# Patient Record
Sex: Male | Born: 1945 | Race: Black or African American | Hispanic: No | Marital: Married | Smoking: Never smoker
Health system: Southern US, Community
[De-identification: ages and names within clinical notes are randomized; demographics above are authoritative.]

## PROBLEM LIST (undated history)

## (undated) DIAGNOSIS — Z87442 Personal history of urinary calculi: Secondary | ICD-10-CM

## (undated) DIAGNOSIS — I10 Essential (primary) hypertension: Secondary | ICD-10-CM

## (undated) DIAGNOSIS — G4730 Sleep apnea, unspecified: Secondary | ICD-10-CM

## (undated) DIAGNOSIS — C801 Malignant (primary) neoplasm, unspecified: Secondary | ICD-10-CM

## (undated) DIAGNOSIS — E7800 Pure hypercholesterolemia, unspecified: Secondary | ICD-10-CM

## (undated) DIAGNOSIS — M1990 Unspecified osteoarthritis, unspecified site: Secondary | ICD-10-CM

## (undated) HISTORY — PX: HERNIA REPAIR: SHX51

## (undated) HISTORY — PX: COLON SURGERY: SHX602

---

## 2001-03-10 ENCOUNTER — Inpatient Hospital Stay (HOSPITAL_COMMUNITY): Admission: RE | Admit: 2001-03-10 | Discharge: 2001-03-14 | Payer: Self-pay | Admitting: Urology

## 2002-10-14 ENCOUNTER — Encounter: Payer: Self-pay | Admitting: Emergency Medicine

## 2002-10-14 ENCOUNTER — Inpatient Hospital Stay (HOSPITAL_COMMUNITY): Admission: EM | Admit: 2002-10-14 | Discharge: 2002-10-15 | Payer: Self-pay | Admitting: Emergency Medicine

## 2011-01-09 ENCOUNTER — Ambulatory Visit: Payer: 59 | Attending: Internal Medicine

## 2011-01-09 DIAGNOSIS — G4733 Obstructive sleep apnea (adult) (pediatric): Secondary | ICD-10-CM | POA: Insufficient documentation

## 2011-10-22 ENCOUNTER — Other Ambulatory Visit (HOSPITAL_COMMUNITY): Payer: Self-pay | Admitting: Internal Medicine

## 2011-10-22 ENCOUNTER — Ambulatory Visit (HOSPITAL_COMMUNITY)
Admission: RE | Admit: 2011-10-22 | Discharge: 2011-10-22 | Disposition: A | Discharge status: Home / Self Care | Payer: Medicare Other | Source: Ambulatory Visit | Attending: Internal Medicine | Admitting: Internal Medicine

## 2011-10-22 DIAGNOSIS — M25469 Effusion, unspecified knee: Secondary | ICD-10-CM | POA: Insufficient documentation

## 2011-10-22 DIAGNOSIS — R609 Edema, unspecified: Secondary | ICD-10-CM

## 2011-10-22 DIAGNOSIS — R937 Abnormal findings on diagnostic imaging of other parts of musculoskeletal system: Secondary | ICD-10-CM | POA: Insufficient documentation

## 2011-10-22 DIAGNOSIS — M25569 Pain in unspecified knee: Secondary | ICD-10-CM | POA: Insufficient documentation

## 2011-10-22 DIAGNOSIS — R52 Pain, unspecified: Secondary | ICD-10-CM

## 2011-11-12 ENCOUNTER — Other Ambulatory Visit (HOSPITAL_COMMUNITY): Payer: Self-pay | Admitting: Orthopaedic Surgery

## 2011-11-12 DIAGNOSIS — M25562 Pain in left knee: Secondary | ICD-10-CM

## 2011-11-14 ENCOUNTER — Ambulatory Visit (HOSPITAL_COMMUNITY)
Admission: RE | Admit: 2011-11-14 | Discharge: 2011-11-14 | Disposition: A | Discharge status: Home / Self Care | Payer: Medicare Other | Source: Ambulatory Visit | Attending: Orthopaedic Surgery | Admitting: Orthopaedic Surgery

## 2011-11-14 DIAGNOSIS — M23329 Other meniscus derangements, posterior horn of medial meniscus, unspecified knee: Secondary | ICD-10-CM | POA: Insufficient documentation

## 2011-11-14 DIAGNOSIS — M25569 Pain in unspecified knee: Secondary | ICD-10-CM | POA: Insufficient documentation

## 2011-11-14 DIAGNOSIS — M7120 Synovial cyst of popliteal space [Baker], unspecified knee: Secondary | ICD-10-CM | POA: Insufficient documentation

## 2011-11-14 DIAGNOSIS — M25562 Pain in left knee: Secondary | ICD-10-CM

## 2012-01-09 ENCOUNTER — Other Ambulatory Visit: Payer: Self-pay | Admitting: Orthopedic Surgery

## 2012-01-20 ENCOUNTER — Encounter (HOSPITAL_BASED_OUTPATIENT_CLINIC_OR_DEPARTMENT_OTHER): Payer: Self-pay | Admitting: *Deleted

## 2012-01-20 NOTE — Progress Notes (Signed)
Pt instructed npo p mn 3/7.  To [facility] 3/8 @ 1215.  Needs hgb, ekg on arrival.

## 2012-01-22 ENCOUNTER — Other Ambulatory Visit: Payer: Self-pay | Admitting: Orthopedic Surgery

## 2012-01-23 ENCOUNTER — Encounter (HOSPITAL_BASED_OUTPATIENT_CLINIC_OR_DEPARTMENT_OTHER)
Admission: RE | Disposition: A | Discharge status: Home / Self Care | Payer: Self-pay | Source: Ambulatory Visit | Attending: Orthopedic Surgery

## 2012-01-23 ENCOUNTER — Ambulatory Visit (HOSPITAL_BASED_OUTPATIENT_CLINIC_OR_DEPARTMENT_OTHER)
Admission: RE | Admit: 2012-01-23 | Discharge: 2012-01-23 | Disposition: A | Discharge status: Home / Self Care | Payer: Medicare Other | Source: Ambulatory Visit | Attending: Orthopedic Surgery | Admitting: Orthopedic Surgery

## 2012-01-23 ENCOUNTER — Ambulatory Visit (HOSPITAL_BASED_OUTPATIENT_CLINIC_OR_DEPARTMENT_OTHER): Payer: Medicare Other | Admitting: Anesthesiology

## 2012-01-23 ENCOUNTER — Other Ambulatory Visit: Payer: Self-pay

## 2012-01-23 ENCOUNTER — Encounter (HOSPITAL_BASED_OUTPATIENT_CLINIC_OR_DEPARTMENT_OTHER): Payer: Self-pay | Admitting: Anesthesiology

## 2012-01-23 ENCOUNTER — Encounter (HOSPITAL_BASED_OUTPATIENT_CLINIC_OR_DEPARTMENT_OTHER): Payer: Self-pay | Admitting: *Deleted

## 2012-01-23 DIAGNOSIS — X58XXXA Exposure to other specified factors, initial encounter: Secondary | ICD-10-CM | POA: Insufficient documentation

## 2012-01-23 DIAGNOSIS — Z9889 Other specified postprocedural states: Secondary | ICD-10-CM

## 2012-01-23 DIAGNOSIS — Z859 Personal history of malignant neoplasm, unspecified: Secondary | ICD-10-CM | POA: Insufficient documentation

## 2012-01-23 DIAGNOSIS — Z79899 Other long term (current) drug therapy: Secondary | ICD-10-CM | POA: Insufficient documentation

## 2012-01-23 DIAGNOSIS — G4730 Sleep apnea, unspecified: Secondary | ICD-10-CM | POA: Insufficient documentation

## 2012-01-23 DIAGNOSIS — IMO0002 Reserved for concepts with insufficient information to code with codable children: Secondary | ICD-10-CM | POA: Insufficient documentation

## 2012-01-23 DIAGNOSIS — M1710 Unilateral primary osteoarthritis, unspecified knee: Secondary | ICD-10-CM | POA: Insufficient documentation

## 2012-01-23 DIAGNOSIS — Z85038 Personal history of other malignant neoplasm of large intestine: Secondary | ICD-10-CM | POA: Insufficient documentation

## 2012-01-23 HISTORY — DX: Sleep apnea, unspecified: G47.30

## 2012-01-23 HISTORY — DX: Malignant (primary) neoplasm, unspecified: C80.1

## 2012-01-23 HISTORY — DX: Unspecified osteoarthritis, unspecified site: M19.90

## 2012-01-23 LAB — POCT I-STAT 4, (NA,K, GLUC, HGB,HCT)
Glucose, Bld: 101 mg/dL — ABNORMAL HIGH (ref 70–99)
HCT: 41 % (ref 39.0–52.0)
Hemoglobin: 13.9 g/dL (ref 13.0–17.0)
Potassium: 3.8 meq/L (ref 3.5–5.1)
Sodium: 145 meq/L (ref 135–145)

## 2012-01-23 SURGERY — ARTHROSCOPY, KNEE, WITH MEDIAL MENISCECTOMY
Anesthesia: General | Site: Knee | Laterality: Left | Wound class: Clean

## 2012-01-23 MED ORDER — LACTATED RINGERS IV SOLN
INTRAVENOUS | Status: DC
  Administered 2012-01-23 (×3): via INTRAVENOUS

## 2012-01-23 MED ORDER — OXYCODONE-ACETAMINOPHEN 5-325 MG PO TABS
1.0000 | ORAL_TABLET | ORAL | Status: DC | PRN
  Administered 2012-01-23: 1 via ORAL

## 2012-01-23 MED ORDER — ONDANSETRON HCL 4 MG/2ML IJ SOLN
INTRAMUSCULAR | Status: DC | PRN
  Administered 2012-01-23: 4 mg via INTRAVENOUS

## 2012-01-23 MED ORDER — OXYCODONE-ACETAMINOPHEN 7.5-325 MG PO TABS: 1.0000 | ORAL_TABLET | ORAL | Status: AC | PRN

## 2012-01-23 MED ORDER — DEXAMETHASONE SODIUM PHOSPHATE 4 MG/ML IJ SOLN
INTRAMUSCULAR | Status: DC | PRN
  Administered 2012-01-23: 8 mg via INTRAVENOUS

## 2012-01-23 MED ORDER — LACTATED RINGERS IV SOLN: INTRAVENOUS | Status: DC

## 2012-01-23 MED ORDER — FENTANYL CITRATE 0.05 MG/ML IJ SOLN
INTRAMUSCULAR | Status: DC | PRN
  Administered 2012-01-23 (×6): 25 ug via INTRAVENOUS
  Administered 2012-01-23: 50 ug via INTRAVENOUS

## 2012-01-23 MED ORDER — PROMETHAZINE HCL 25 MG/ML IJ SOLN: 6.2500 mg | INTRAMUSCULAR | Status: DC | PRN

## 2012-01-23 MED ORDER — PROPOFOL 10 MG/ML IV EMUL
INTRAVENOUS | Status: DC | PRN
  Administered 2012-01-23: 260 mg via INTRAVENOUS

## 2012-01-23 MED ORDER — SODIUM CHLORIDE 0.9 % IR SOLN
Status: DC | PRN
  Administered 2012-01-23: 15:00:00

## 2012-01-23 MED ORDER — METHOCARBAMOL 500 MG PO TABS: 500.0000 mg | ORAL_TABLET | Freq: Four times a day (QID) | ORAL | Status: AC

## 2012-01-23 MED ORDER — FENTANYL CITRATE 0.05 MG/ML IJ SOLN
25.0000 ug | INTRAMUSCULAR | Status: DC | PRN
  Administered 2012-01-23 (×2): 25 ug via INTRAVENOUS

## 2012-01-23 MED ORDER — LIDOCAINE HCL (CARDIAC) 20 MG/ML IV SOLN
INTRAVENOUS | Status: DC | PRN
  Administered 2012-01-23: 80 mg via INTRAVENOUS

## 2012-01-23 MED ORDER — MORPHINE SULFATE 4 MG/ML IJ SOLN
INTRAMUSCULAR | Status: DC | PRN
  Administered 2012-01-23: 4 mg

## 2012-01-23 MED ORDER — BUPIVACAINE-EPINEPHRINE 0.5% -1:200000 IJ SOLN
INTRAMUSCULAR | Status: DC | PRN
  Administered 2012-01-23: 20 mL

## 2012-01-23 MED ORDER — POVIDONE-IODINE 7.5 % EX SOLN: Freq: Once | CUTANEOUS | Status: DC

## 2012-01-23 SURGICAL SUPPLY — 51 items
BANDAGE ELASTIC 6 VELCRO ST LF (GAUZE/BANDAGES/DRESSINGS) ×4 IMPLANT
BANDAGE ESMARK 6X9 LF (GAUZE/BANDAGES/DRESSINGS) ×2 IMPLANT
BANDAGE GAUZE ELAST BULKY 4 IN (GAUZE/BANDAGES/DRESSINGS) ×3 IMPLANT
BLADE 4.2CUDA (BLADE) IMPLANT
BLADE CUDA 5.5 (BLADE) IMPLANT
BLADE CUDA SHAVER 3.5 (BLADE) ×3 IMPLANT
BLADE CUTTER GATOR 3.5 (BLADE) IMPLANT
BLADE GREAT WHITE 4.2 (BLADE) IMPLANT
BNDG ESMARK 6X9 LF (GAUZE/BANDAGES/DRESSINGS) ×3
CANISTER SUCT LVC 12 LTR MEDI- (MISCELLANEOUS) ×4 IMPLANT
CANISTER SUCTION 1200CC (MISCELLANEOUS) ×3 IMPLANT
CLOTH BEACON ORANGE TIMEOUT ST (SAFETY) ×3 IMPLANT
DRAPE ARTHROSCOPY W/POUCH 114 (DRAPES) ×3 IMPLANT
DRAPE LG THREE QUARTER DISP (DRAPES) ×3 IMPLANT
DRSG EMULSION OIL 3X3 NADH (GAUZE/BANDAGES/DRESSINGS) ×3 IMPLANT
DRSG PAD ABDOMINAL 8X10 ST (GAUZE/BANDAGES/DRESSINGS) ×2 IMPLANT
DURAPREP 26ML APPLICATOR (WOUND CARE) ×3 IMPLANT
ELECT MENISCUS 165MM 90D (ELECTRODE) IMPLANT
ELECT REM PT RETURN 9FT ADLT (ELECTROSURGICAL)
ELECTRODE REM PT RTRN 9FT ADLT (ELECTROSURGICAL) IMPLANT
GLOVE BIOGEL PI IND STRL 8 (GLOVE) ×2 IMPLANT
GLOVE BIOGEL PI INDICATOR 8 (GLOVE) ×1
GLOVE ECLIPSE 6.5 STRL STRAW (GLOVE) ×4 IMPLANT
GLOVE ECLIPSE 8.0 STRL XLNG CF (GLOVE) ×6 IMPLANT
GLOVE INDICATOR 6.5 STRL GRN (GLOVE) ×4 IMPLANT
GLOVE INDICATOR 8.0 STRL GRN (GLOVE) ×4 IMPLANT
GOWN PREVENTION PLUS LG XLONG (DISPOSABLE) ×5 IMPLANT
GOWN STRL REIN XL XLG (GOWN DISPOSABLE) ×3 IMPLANT
IV NS IRRIG 3000ML ARTHROMATIC (IV SOLUTION) ×6 IMPLANT
KNEE WRAP E Z 3 GEL PACK (MISCELLANEOUS) ×3 IMPLANT
NDL HYPO 18GX1.5 BLUNT FILL (NEEDLE) ×1 IMPLANT
NDL SAFETY ECLIPSE 18X1.5 (NEEDLE) ×2 IMPLANT
NEEDLE HYPO 18GX1.5 BLUNT FILL (NEEDLE) ×3 IMPLANT
NEEDLE HYPO 18GX1.5 SHARP (NEEDLE) ×1
PACK ARTHROSCOPY DSU (CUSTOM PROCEDURE TRAY) ×3 IMPLANT
PACK BASIN DAY SURGERY FS (CUSTOM PROCEDURE TRAY) ×3 IMPLANT
PADDING CAST ABS 4INX4YD NS (CAST SUPPLIES) ×1
PADDING CAST ABS COTTON 4X4 ST (CAST SUPPLIES) ×2 IMPLANT
PADDING WEBRIL 6 STERILE (GAUZE/BANDAGES/DRESSINGS) ×2 IMPLANT
PENCIL BUTTON HOLSTER BLD 10FT (ELECTRODE) IMPLANT
SET ARTHROSCOPY TUBING (MISCELLANEOUS) ×1
SET ARTHROSCOPY TUBING LN (MISCELLANEOUS) ×2 IMPLANT
SPONGE GAUZE 4X4 12PLY (GAUZE/BANDAGES/DRESSINGS) ×3 IMPLANT
SUT ETHIBOND 2 OS 4 DA (SUTURE) IMPLANT
SUT ETHILON 4 0 PS 2 18 (SUTURE) ×3 IMPLANT
SUT VIC AB 0 CT1 36 (SUTURE) IMPLANT
SUT VIC AB 2-0 PS2 27 (SUTURE) IMPLANT
SYRINGE 10CC LL (SYRINGE) ×3 IMPLANT
TOWEL OR 17X24 6PK STRL BLUE (TOWEL DISPOSABLE) ×3 IMPLANT
WAND 90 DEG TURBOVAC W/CORD (SURGICAL WAND) IMPLANT
WATER STERILE IRR 500ML POUR (IV SOLUTION) ×3 IMPLANT

## 2012-01-23 NOTE — H&P (Signed)
Frank [name] is an 66 y.o. male.   Chief Complaint: painful lt knee HPI:mri demonstrates medial meniscus tear  Past Medical History  Diagnosis Date  . Cancer     colon cancer  . Arthritis     left knee  . Sleep apnea     uses cpap    Past Surgical History  Procedure Date  . Hernia repair     bilateral  . Colon surgery     hx colon ca    History reviewed. No pertinent family history. Social History:  reports that he has never smoked. He does not have any smokeless tobacco history on file. He reports that he does not drink alcohol. His drug history not on file.  Allergies: No Known Allergies  Medications Prior to Admission  Medication Dose Route Frequency Provider Last Rate Last Dose  . 1 mL EPINEPHrine 1 mg/mL (1:1000) in 0.9% Normal Saline 3000 mL irrigation    PRN Illene Labrador Monteen Toops, MD      . 1 mL EPINEPHrine 1 mg/mL (1:1000) in 0.9% Normal Saline 3000 mL irrigation    PRN Illene Labrador Oluwatimileyin Vivier, MD      . bupivacaine-EPINEPHrine (MARCAINE W/ EPI) 0.5 % (with pres) injection    PRN Illene Labrador Marijayne Rauth, MD   20 mL at 01/23/12 1340  . lactated ringers infusion   Intravenous Continuous Gaetano Hawthorne, MD      . morphine 4 MG/ML injection    PRN Drucilla Schmidt, MD   4 mg at 01/23/12 1339  . povidone-iodine (BETADINE) 7.5 % scrub   Topical Once Drucilla Schmidt, MD       Medications Prior to Admission  Medication Sig Dispense Refill  . atorvastatin (LIPITOR) 40 MG tablet Take 40 mg by mouth at bedtime.      . cholecalciferol (VITAMIN D) 400 UNITS TABS Take 500 Units by mouth.      . ferrous sulfate 325 (65 FE) MG tablet Take 325 mg by mouth daily with breakfast.      . Garlic TABS Take by mouth.      . naproxen sodium (ANAPROX) 220 MG tablet Take 220 mg by mouth as needed.      . vitamin C (ASCORBIC ACID) 500 MG tablet Take 500 mg by mouth daily.      . vitamin E 400 UNIT capsule Take 400 Units by mouth daily.        Results for orders placed during the hospital encounter  of 01/23/12 (from the past 48 hour(s))  POCT I-STAT 4, (NA,K, GLUC, HGB,HCT)     Status: Abnormal   Collection Time   01/23/12  1:10 PM      Component Value Range Comment   Sodium 145  135 - 145 (mEq/L)    Potassium 3.8  3.5 - 5.1 (mEq/L)    Glucose, Bld 101 (*) 70 - 99 (mg/dL)    HCT 09.8  11.9 - 14.7 (%)    Hemoglobin 13.9  13.0 - 17.0 (g/dL)    No results found.  ROS  Blood pressure 158/95, pulse 65, temperature 98.7 F (37.1 C), temperature source Oral, resp. rate 18, height 6\' 2"  (1.88 m), weight 86.183 kg (190 lb), SpO2 98.00%. Physical Exam  Constitutional: He is oriented to person, place, and time. He appears well-developed and well-nourished.  HENT:  Head: Normocephalic and atraumatic.  Right Ear: External ear normal.  Left Ear: External ear normal.  Nose: Nose normal.  Mouth/Throat: Oropharynx is clear and  moist.  Cardiovascular: Normal rate, regular rhythm, normal heart sounds and intact distal pulses.   Respiratory: Effort normal and breath sounds normal.  GI: Soft. Bowel sounds are normal.  Musculoskeletal: Normal range of motion. He exhibits tenderness.       Tender medial joint line left knee  Neurological: He is alert and oriented to person, place, and time. He has normal reflexes.  Skin: Skin is warm and dry.  Psychiatric: He has a normal mood and affect. His behavior is normal. Judgment and thought content normal.     Assessment/Plan Torn medial meniscus lt knee Lt jnee arthroscopy with partial medial meniscectomy Alashia Brownfield P 01/23/2012, 2:13 PM

## 2012-01-23 NOTE — Transfer of Care (Signed)
Immediate Anesthesia Transfer of Care Note  Patient: Frank [name]  Procedure(s) Performed: Procedure(s) (LRB): KNEE ARTHROSCOPY WITH MEDIAL MENISECTOMY (Left)  Patient Location: Patient transported to PACU with oxygen via face mask at 4 Liters / Min  Anesthesia Type: General  Level of Consciousness: awake and alert   Airway & Oxygen Therapy: Patient Spontanous Breathing and Patient connected to face mask oxygen  Post-op Assessment: Report given to PACU RN and Post -op Vital signs reviewed and stable  Post vital signs: Reviewed and stable  Dentition: Teeth and oropharynx remain in pre-op condition  Complications: No apparent anesthesia complications

## 2012-01-23 NOTE — Anesthesia Preprocedure Evaluation (Signed)
Anesthesia Evaluation  Patient identified by MRN, date of birth, ID band Patient awake    Reviewed: Allergy & Precautions, H&P , NPO status , Patient's Chart, lab work & pertinent test results  Airway Mallampati: II TM Distance: >3 FB Neck ROM: Full    Dental No notable dental hx. (+) Caps, Teeth Intact and Dental Advisory Given   Pulmonary neg pulmonary ROS, sleep apnea and Continuous Positive Airway Pressure Ventilation ,  breath sounds clear to auscultation  Pulmonary exam normal       Cardiovascular Exercise Tolerance: Good negative cardio ROS  Rhythm:Regular Rate:Normal     Neuro/Psych negative neurological ROS  negative psych ROS   GI/Hepatic negative GI ROS, Neg liver ROS,   Endo/Other  negative endocrine ROS  Renal/GU negative Renal ROS  negative genitourinary   Musculoskeletal negative musculoskeletal ROS (+)   Abdominal   Peds negative pediatric ROS (+)  Hematology negative hematology ROS (+)   Anesthesia Other Findings   Reproductive/Obstetrics negative OB ROS                           Anesthesia Physical Anesthesia Plan  ASA: II  Anesthesia Plan: General   Post-op Pain Management:    Induction: Intravenous  Airway Management Planned: LMA  Additional Equipment:   Intra-op Plan:   Post-operative Plan:   Informed Consent: I have reviewed the patients History and Physical, chart, labs and discussed the procedure including the risks, benefits and alternatives for the proposed anesthesia with the patient or authorized representative who has indicated his/her understanding and acceptance.   Dental advisory given  Plan Discussed with: CRNA  Anesthesia Plan Comments:         Anesthesia Quick Evaluation

## 2012-01-23 NOTE — Anesthesia Procedure Notes (Signed)
Procedure Name: LMA Insertion Date/Time: 01/23/2012 2:30 PM Performed by: Lorrin Jackson Pre-anesthesia Checklist: Patient identified, Emergency Drugs available, Suction available and Patient being monitored Patient Re-evaluated:Patient Re-evaluated prior to inductionOxygen Delivery Method: Circle System Utilized Preoxygenation: Pre-oxygenation with 100% oxygen Intubation Type: IV induction Ventilation: Mask ventilation without difficulty LMA: LMA with gastric port inserted LMA Size: 4.0 Number of attempts: 1 Placement Confirmation: positive ETCO2 Tube secured with: Tape Dental Injury: Teeth and Oropharynx as per pre-operative assessment

## 2012-01-23 NOTE — Brief Op Note (Signed)
01/23/2012  3:47 PM  PATIENT:  Frank [name]  66 y.o. male  PRE-OPERATIVE DIAGNOSIS:  LEFT KNEE TORN MEDIAL MENISCUS and degenerative arthritis  POST-OPERATIVE DIAGNOSIS:  LEFT KNEE TORN MEDIAL MENISCUS and degenerative arthritis  PROCEDURE:  Procedure(s) (LRB): KNEE ARTHROSCOPY WITH partial medial meniscectomy and debridement medial femoral condyle  SURGEON:  Surgeon(s) and Role:    * Drucilla Schmidt, MD - Primary  PHYSICIAN ASSISTANT:   ASSISTANTS: nurse  ANESTHESIA:   general  EBL:  Total I/O In: 1300 [I.V.:1300] Out: -   BLOOD ADMINISTERED:none  DRAINS: none   LOCAL MEDICATIONS USED:  MARCAINE     SPECIMEN:  No Specimen  DISPOSITION OF SPECIMEN:  N/A  COUNTS:  YES  TOURNIQUET:   Total Tourniquet Time Documented: Thigh (Left) - 45 minutes  DICTATION: .Other Dictation: Dictation Number [id number]  PLAN OF CARE: Discharge to home after PACU  PATIENT DISPOSITION:  PACU - hemodynamically stable.   Delay start of Pharmacological VTE agent (>24hrs) due to surgical blood loss or risk of bleeding: yes

## 2012-01-24 NOTE — Op Note (Signed)
Frank [name], Frank NO.:  [id number]  MEDICAL RECORD NO.:  [id number]  LOCATION:                               FACILITY:  [facility]  PHYSICIAN:  Marlowe Kays, M.D.  DATE OF BIRTH:  1946/05/24  DATE OF PROCEDURE:  01/23/2012 DATE OF DISCHARGE:                              OPERATIVE REPORT   PREOPERATIVE DIAGNOSES: 1. Torn medial meniscus. 2. Osteoarthritis, left knee.  POSTOPERATIVE DIAGNOSES: 1. Torn medial meniscus. 2. Osteoarthritis, left knee.  OPERATION:  Left knee arthroscopy with partial medial meniscectomy and shaving of medial femoral condyle.  SURGEON:  Marlowe Kays, M.D.  ASSISTANT:  Nurse.  ANESTHESIA:  General.  PATHOLOGY AND JUSTIFICATION FOR PROCEDURE:  Because of painful knee, he had an MRI, which demonstrated the preoperative diagnoses.  Accordingly, he is here today for the above-mentioned surgery.  DESCRIPTION OF PROCEDURE:  Satisfied general anesthesia, Ace wrap, and knee support to right lower extremity, pneumatic tourniquet to left lower extremity with leg Esmarch'd out nonsterilely and tourniquet inflated to 325 mmHg.  Thigh stabilizer applied.  Left leg was then prepped with DuraPrep and stabilizer to ankle, draped in sterile field. Time-out performed.  Superior medial saline inflow.  First, an anterolateral portal, medial compartment joint was evaluated.  He had an area of full-thickness wear almost like a divot in the midportion of the medial tibial plateau adjacent to the very comminuted tear of the medial meniscus, which extended into the intercondylar area.  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  With a 3.5 shaver, I smoothed down the medial femoral condyle and also a portion of the medial meniscus tear, removing the remainder with combination of arthroscopic scissors and a combination of baskets.  The final meniscus was stable on probing.  Looking at the medial gutter  and suprapatellar area, he had some minimal wear of the patella, which did not require shaving.  I then reversed portals.  The lateral meniscus showed some mild synovitis, but nothing operable.  Knee joint was then irrigated to clear, all fluid possible removed.  I closed 2 entry portals with 4-0 nylon and then injected 20 cc of 0.5% Marcaine with adrenaline and 4 mg of morphine through the inflow apparatus, which was removed. This portal closed with 4-0 nylon.  Betadine, Adaptic, dry sterile dressing were applied.  He tolerated the procedure well.  At the time of this dictation, he was on his way to the recovery room in satisfactory condition with no complications.          ______________________________ Marlowe Kays, M.D.     JA/MEDQ  D:  01/23/2012  T:  01/24/2012  Job:  [id number]

## 2012-01-26 NOTE — Anesthesia Postprocedure Evaluation (Signed)
Anesthesia Post Note  Patient: Frank [name]  Procedure(s) Performed: Procedure(s) (LRB): KNEE ARTHROSCOPY WITH MEDIAL MENISECTOMY (Left)  Anesthesia type: General  Patient location: PACU  Post pain: Pain level controlled  Post assessment: Post-op Vital signs reviewed  Last Vitals:  Filed Vitals:   01/23/12 1730  BP: 156/89  Pulse: 67  Temp: 36.2 C  Resp: 16    Post vital signs: Reviewed  Level of consciousness: sedated  Complications: No apparent anesthesia complications

## 2012-02-17 ENCOUNTER — Ambulatory Visit (HOSPITAL_COMMUNITY)
Admission: RE | Admit: 2012-02-17 | Discharge: 2012-02-17 | Disposition: A | Discharge status: Home / Self Care | Payer: Medicare Other | Source: Ambulatory Visit | Attending: Orthopedic Surgery | Admitting: Orthopedic Surgery

## 2012-02-17 DIAGNOSIS — M6281 Muscle weakness (generalized): Secondary | ICD-10-CM | POA: Insufficient documentation

## 2012-02-17 DIAGNOSIS — M25669 Stiffness of unspecified knee, not elsewhere classified: Secondary | ICD-10-CM | POA: Insufficient documentation

## 2012-02-17 DIAGNOSIS — M25569 Pain in unspecified knee: Secondary | ICD-10-CM | POA: Insufficient documentation

## 2012-02-17 DIAGNOSIS — IMO0001 Reserved for inherently not codable concepts without codable children: Secondary | ICD-10-CM | POA: Insufficient documentation

## 2012-02-17 NOTE — Evaluation (Signed)
Physical Therapy Evaluation  Patient Details  Name: Frank [name] MRN: [id number] Date of Birth: 11-14-1946  Today's Date: 02/17/2012 Time: 0902-0930 Time Calculation (min): 28 min Charges: 1 eval Visit#: 1  of 4   Re-eval: 03/01/12 Assessment Diagnosis: L knee scope Surgical Date: 01/23/12 Next MD Visit: 03/01/12 Prior Therapy: None  Past Medical History:  Past Medical History  Diagnosis Date  . Cancer     colon cancer  . Arthritis     left knee  . Sleep apnea     uses cpap   Past Surgical History:  Past Surgical History  Procedure Date  . Hernia repair     bilateral  . Colon surgery     hx colon ca    Subjective Symptoms/Limitations Symptoms: Pt reports that he bumped his Lt knee about 6 months ago on a cube of bricks and it has been painful ever since.  He had surgery about 4 weeks out of surgery.  He reports overall he is feeling pretty good, and only has pain when moving it in an awkward direction.  His c/co is weakness to his legs.  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  He enjoys fixing things up.  He enjoys walking for exercise (3-4 miles).  Cognition/Observation Observation/Other Assessments Observations: Pleasant male who is able to easily sit in his chair.  Comes in with a neoprene brace on his knee No visible edema.  Portals are healing well  Assessment LLE AROM (degrees) LLE Overall AROM Comments: Lt knee 0-125 LLE Strength Left Hip Flexion: 4/5 Left Hip Extension: 3+/5 Left Hip ABduction: 4/5 Left Hip ADduction: 3+/5 Left Knee Flexion: 5/5 Left Knee Extension: 4/5 Palpation Palpation: Mild pain and tenderness to medial joint line with deep palpation.    Exercise/Treatments Supine Short Arc Quad Sets: 5 reps (10 sec holds, HEP) Straight Leg Raises: 15 reps (HEP) Sidelying Hip ABduction: 10 reps (HEP) Hip ADduction: 10 reps (HEP) Clams: 3 reps 10 sec holds Prone  Hip Extension: Left;10 reps (HEP) Other Prone Exercises: Heel Squezze 5x10 sec; HEP  Physical Therapy Assessment and Plan PT Assessment and Plan Clinical Impression Statement: Pt is a 66 year old male s/p L knee scope on 01/23/12.  After examination it was found that he current impairments including decreased muscle strength and endurance which are limitng his ability to fully participate in ADL's and IADL's including his exercise rountine.  Pt will benefit from skilled OP PT to address above impairments in order to maximize function and reach goals.  Rehab Potential: Good PT Frequency: Min 2X/week PT Duration:  (5 visits. ) PT Treatment/Interventions: Gait training;Stair training;Functional mobility training;Therapeutic activities;Therapeutic exercise;Neuromuscular re-education;Patient/family education (NO MODALITES contraindicated for CANCER) PT Plan: Cont with strengthening LE.  Functional squats/wall squats, heel/toe raises, stair training, clams (for endurance to gluteus medius).  Focus on HEP.    Goals Home Exercise Program Pt will Perform Home Exercise Program: Independently PT Goal: Perform Home Exercise Program - Progress: Goal set today PT Short Term Goals Time to Complete Short Term Goals: 2 weeks PT Short Term Goal 1: Pt will present with 5/5 strength throughout LLE in order to complete necessary ADL's. PT Short Term Goal 2: Pt will improve his LLE endurance and tolerate ambulating for 3 miles to  return to his exercise routine.   Problem List Patient Active Problem List  Diagnoses  . Muscle weakness (generalized)    PT Plan of Care PT Home Exercise Plan: see scanned document ( 4 way SLR, heel squeezes, saq) Consulted and Agree with Plan of Care:  Patient  Frank [name] 02/17/2012, 9:48 AM  Physician Documentation Your signature is required to indicate approval of the treatment plan as stated above.  Please sign and either send electronically or make a copy of this report for your files and return this physician signed original.   Please mark one 1.__approve of plan  2. ___approve of plan with the following conditions.   ______________________________                                                          _____________________ Physician Signature                                                                                                             Date

## 2012-02-24 ENCOUNTER — Ambulatory Visit (HOSPITAL_COMMUNITY)
Admission: RE | Admit: 2012-02-24 | Discharge: 2012-02-24 | Disposition: A | Discharge status: Home / Self Care | Payer: Medicare Other | Source: Ambulatory Visit | Attending: Internal Medicine | Admitting: Internal Medicine

## 2012-02-24 NOTE — Progress Notes (Signed)
Physical Therapy Treatment Patient Details  Name: Frank FLIPPEN MRN: [id number] Date of Birth: 12/09/1945  Today's Date: 02/24/2012 Time: 5284-1324 Time Calculation (min): 38 min Visit#: 2  of 4   Re-eval: 03/01/12 Charges:  therex 30'    Subjective: Symptoms/Limitations Symptoms: Pt. reports 5/10 pain upon entrance but only with certain movements.  Pt.reports compliance with HEP and is walking 2 miles a day.   Pain Assessment Currently in Pain?: Yes Pain Score:   5 Pain Location: Knee Pain Orientation: Left Pain Relieving Factors: Only has pain with certain movements.  Exercises Instructed by Trilby Leaver, SPTA under the direction of Makyiah Lie Bascom Levels, PTA/CI. Exercise/Treatments Aerobic Stationary Bike: 6'@ 4.5 Elliptical: add next visit Standing Heel Raises: 15 reps;Limitations Heel Raises Limitations: toeraises 15 reps Lateral Step Up: 10 reps;Step Height: 4";Hand Hold: 1 Forward Step Up: 10 reps;Step Height: 4";Hand Hold: 1 Functional Squat: 15 reps Supine Short Arc Quad Sets: 15 reps;Limitations Short Arc Quad Sets Limitations: D/C to HEP Straight Leg Raises: 15 reps;Limitations Straight Leg Raises Limitations: D/C to HEP Sidelying Hip ABduction: 15 reps;Limitations Hip ABduction Limitations: D/C to HEP Hip ADduction: 15 reps;Limitations Hip ADduction Limitations: D/C to HEP Clams: 5 reps 10 sec holds Prone  Hamstring Curl: 15 reps Hip Extension: 15 reps;Limitations Hip Extension Limitations: D/C to HEP     Physical Therapy Assessment and Plan PT Assessment and Plan Clinical Impression Statement: Pt. able to perform all mat activities in correct form/speed.  Added standing exercises with pt. requiring VC's to perform squats in correct form.  Pt. is progressing well with good compliance. PT Plan: Discharge mat exercises to HEP; Add SLS and progress stength; Change bike to elliptical, add static lunges and LE stretches next visit     PT - End of  Session Activity Tolerance: Patient tolerated treatment well General Behavior During Session: [facility] for tasks performed Cognition: [facility] for tasks performed PT Plan of Care PT Home Exercise Plan: I with HEP; good form   Lorrene Graef B. Bascom Levels, PTA 02/24/2012, 9:49 AM

## 2012-02-26 ENCOUNTER — Ambulatory Visit (HOSPITAL_COMMUNITY)
Admission: RE | Admit: 2012-02-26 | Discharge: 2012-02-26 | Disposition: A | Discharge status: Home / Self Care | Payer: Medicare Other | Source: Ambulatory Visit

## 2012-02-26 NOTE — Progress Notes (Signed)
Physical Therapy Treatment Patient Details  Name: Frank [name] MRN: [id number] Date of Birth: 1946/01/12  Today's Date: 02/26/2012 Time: 5621-3086 Time Calculation (min): 54 min Visit#: 3  of 4   Re-eval: 03/01/12  Charge: therex 39 min Ice 10 min  Subjective: Symptoms/Limitations Symptoms: L knee feeling good, no pain today. Pain Assessment Currently in Pain?: No/denies  Objective:   Exercise/Treatments Aerobic Elliptical: 5' @ L1 Machines for Strengthening Cybex Knee Extension: 1.5 Pl 10x B extend, L lower only Cybex Knee Flexion: 3 PL 2 x 10 Total Gym Leg Press: 2 PL 15x; 4 Pl 15 x Standing Heel Raises: 20 reps;Limitations Heel Raises Limitations: toeraises 20 reps Lateral Step Up: 15 reps;Hand Hold: 0;Step Height: 4" Forward Step Up: 15 reps;Hand Hold: 0;Step Height: 4" Functional Squat: 2 sets;10 reps Rocker Board: 2 minutes;Limitations Rocker Board Limitations: R/L with 2 finger assistance SLS: R27" L 29" max of 3   Modalities Modalities: Cryotherapy Cryotherapy Number Minutes Cryotherapy: 10 Minutes Cryotherapy Location: Knee Type of Cryotherapy: Ice pack  Physical Therapy Assessment and Plan PT Assessment and Plan Clinical Impression Statement: Progressed knee strengthening and stability with new therex, pt with good SLS noted with min cueing for focus to assist with balance.  Able to add cybex for LE strengthening with visbile fatigue noted PT Plan: Continue with current POC, progress to vector stance next session, keep weights same due to increased soreness following this session.    Goals    Problem List Patient Active Problem List  Diagnoses  . Muscle weakness (generalized)    PT - End of Session Activity Tolerance: Patient tolerated treatment well General Behavior During Session: [facility] for tasks performed Cognition: [facility] for tasks performed  GP No functional reporting required  Juel Burrow 02/26/2012, 12:02 PM

## 2012-03-02 ENCOUNTER — Ambulatory Visit (HOSPITAL_COMMUNITY): Payer: Medicare Other

## 2012-03-04 ENCOUNTER — Ambulatory Visit (HOSPITAL_COMMUNITY)
Admission: RE | Admit: 2012-03-04 | Discharge: 2012-03-04 | Disposition: A | Discharge status: Home / Self Care | Payer: Medicare Other | Source: Ambulatory Visit | Attending: Orthopedic Surgery | Admitting: Orthopedic Surgery

## 2012-03-04 ENCOUNTER — Ambulatory Visit (HOSPITAL_COMMUNITY): Payer: Medicare Other | Admitting: Physical Therapy

## 2012-03-04 NOTE — Progress Notes (Signed)
Physical Therapy Treatment Patient Details  Name: Frank [name] MRN: [id number] Date of Birth: 1946-08-26  Today's Date: 03/04/2012 Time: 7829-5621 Time Calculation (min): 38 min Visit#: 4  of 4   Charges: MMT x 1 ROMM x 1 Therex x 20'  Subjective: Symptoms/Limitations Symptoms: Pt reports HEP comliance. No pain. Pain Assessment Currently in Pain?: No/denies  Objective:  03/04/12 1540  LLE AROM (degrees)  LLE Overall AROM Comments 0-128  LLE Strength  Left Hip Flexion (4+/5)  Left Hip Extension (4+/5)  Left Hip ABduction 5/5  Left Hip ADduction 5/5  Left Knee Flexion 5/5  Left Knee Extension 5/5    Exercise/Treatments Aerobic Elliptical: 5' @ L1 Machines for Strengthening Cybex Knee Extension: 3.5 PL 2 x 10 Cybex Knee Flexion: 4.5 PL 2 x 10 Total Gym Leg Press: 4 Pl x 20 Standing Lateral Step Up: 15 reps;Hand Hold: 0;Step Height: 4" Forward Step Up: 15 reps;Hand Hold: 0;Step Height: 4" Step Down: 15 reps;Right;Step Height: 4" Functional Squat: 2 sets;10 reps Rocker Board: 2 minutes;Limitations Rocker Board Limitations: R/L with 2 finger assistance  Physical Therapy Assessment and Plan PT Assessment and Plan Clinical Impression Statement: Pt complete therex without difficulty and without complaint of increase pain. Pt reports that he is no longer limited by knee pain. HEP given for stepups and functional squats. PT Plan: Recommend D/C to HEP to PT.    Goals Home Exercise Program Pt will Perform Home Exercise Program: Independently PT Short Term Goals Time to Complete Short Term Goals: 2 weeks PT Short Term Goal 1: Pt will present with 5/5 strength throughout LLE in order to complete necessary ADL's. PT Short Term Goal 1 - Progress: Partly met (hip flex/ext 4+/5) PT Short Term Goal 2: Pt will improve his LLE endurance and tolerate ambulating for 3 miles to return to his exercise routine.  PT Short Term Goal 2 - Progress: Met  Problem List Patient Active  Problem List  Diagnoses  . Muscle weakness (generalized)    PT - End of Session Activity Tolerance: Patient tolerated treatment well General Behavior During Session: [facility] for tasks performed Cognition: [facility] for tasks performed   Seth Bake, PTA 03/04/2012, 5:51 PM

## 2012-03-09 ENCOUNTER — Ambulatory Visit (HOSPITAL_COMMUNITY): Payer: Medicare Other | Admitting: *Deleted

## 2012-03-11 ENCOUNTER — Ambulatory Visit (HOSPITAL_COMMUNITY): Payer: Medicare Other | Admitting: Physical Therapy

## 2012-03-16 ENCOUNTER — Ambulatory Visit (HOSPITAL_COMMUNITY): Payer: Medicare Other | Admitting: Physical Therapy

## 2012-07-16 ENCOUNTER — Other Ambulatory Visit (HOSPITAL_COMMUNITY): Payer: Self-pay | Admitting: Orthopedic Surgery

## 2012-07-16 DIAGNOSIS — M4310 Spondylolisthesis, site unspecified: Secondary | ICD-10-CM

## 2012-07-20 ENCOUNTER — Ambulatory Visit (HOSPITAL_COMMUNITY)
Admission: RE | Admit: 2012-07-20 | Discharge: 2012-07-20 | Disposition: A | Discharge status: Home / Self Care | Payer: Medicare Other | Source: Ambulatory Visit | Attending: Orthopedic Surgery | Admitting: Orthopedic Surgery

## 2012-07-20 DIAGNOSIS — M4310 Spondylolisthesis, site unspecified: Secondary | ICD-10-CM

## 2012-07-20 DIAGNOSIS — M545 Low back pain, unspecified: Secondary | ICD-10-CM | POA: Insufficient documentation

## 2012-07-20 DIAGNOSIS — Q762 Congenital spondylolisthesis: Secondary | ICD-10-CM | POA: Insufficient documentation

## 2012-07-20 DIAGNOSIS — M25559 Pain in unspecified hip: Secondary | ICD-10-CM | POA: Insufficient documentation

## 2012-07-20 DIAGNOSIS — M5126 Other intervertebral disc displacement, lumbar region: Secondary | ICD-10-CM | POA: Insufficient documentation

## 2013-07-17 IMAGING — CR DG KNEE COMPLETE 4+V*L*
5 series · 5 of 5 positions shown · non-contrast
Comparison: None.

CLINICAL DATA: Trauma 6 months ago.  Pain and swelling.

LEFT KNEE - COMPLETE 4+ VIEW

[view not recorded (1 of 5)]
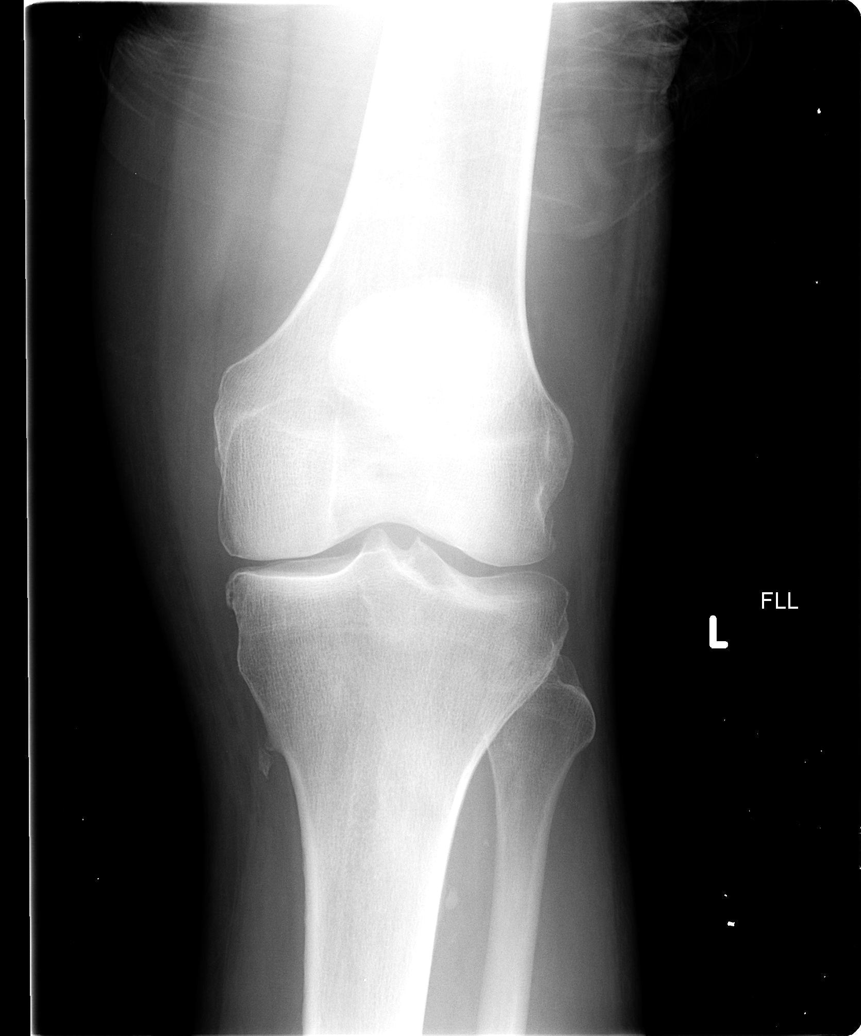

[view not recorded (2 of 5)]
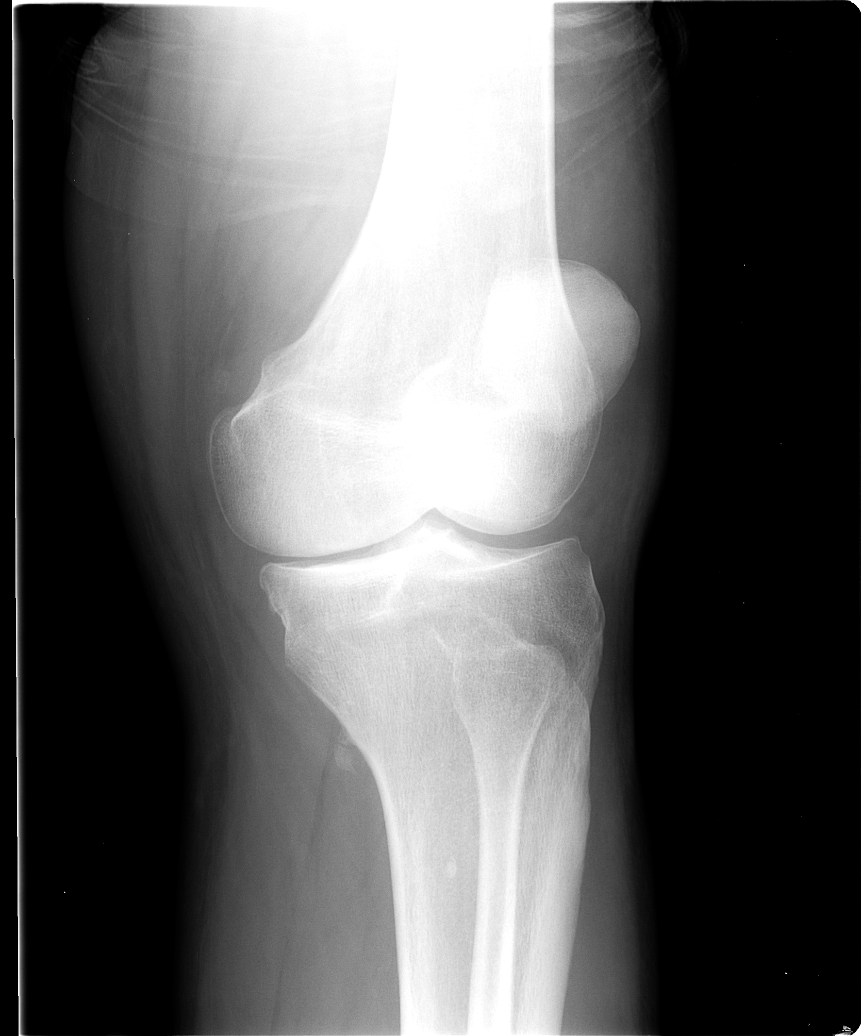

[view not recorded (3 of 5)]
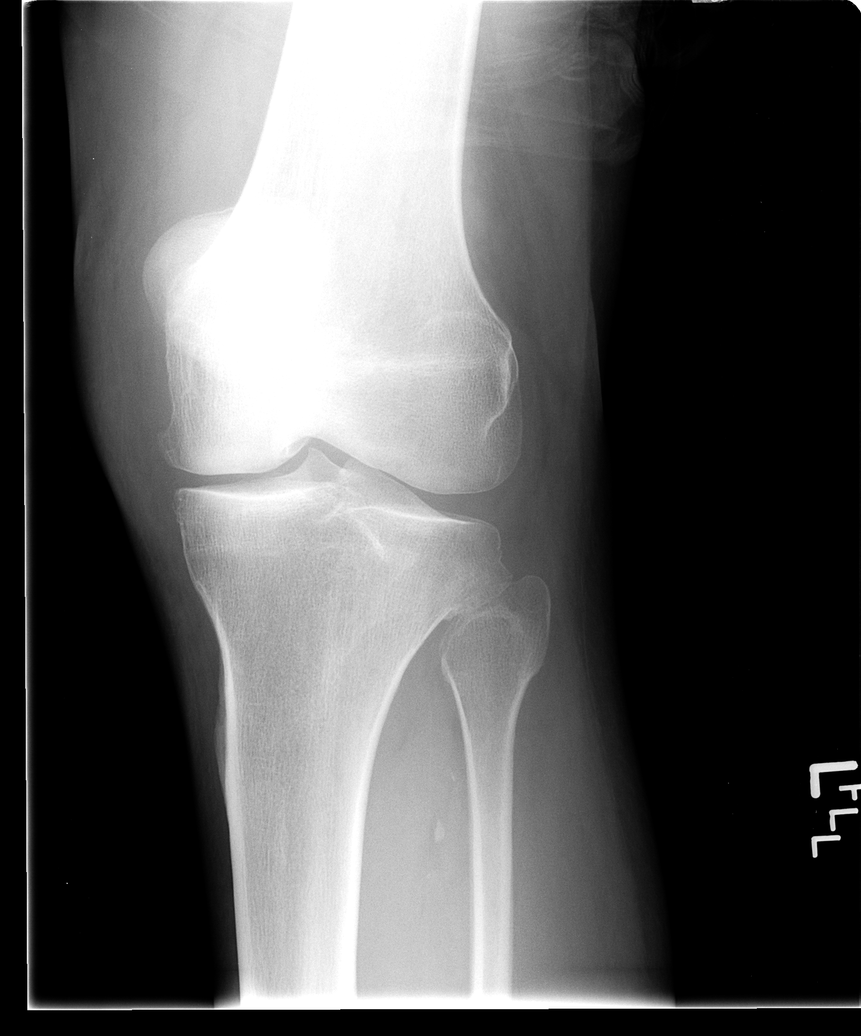

[view not recorded (4 of 5)]
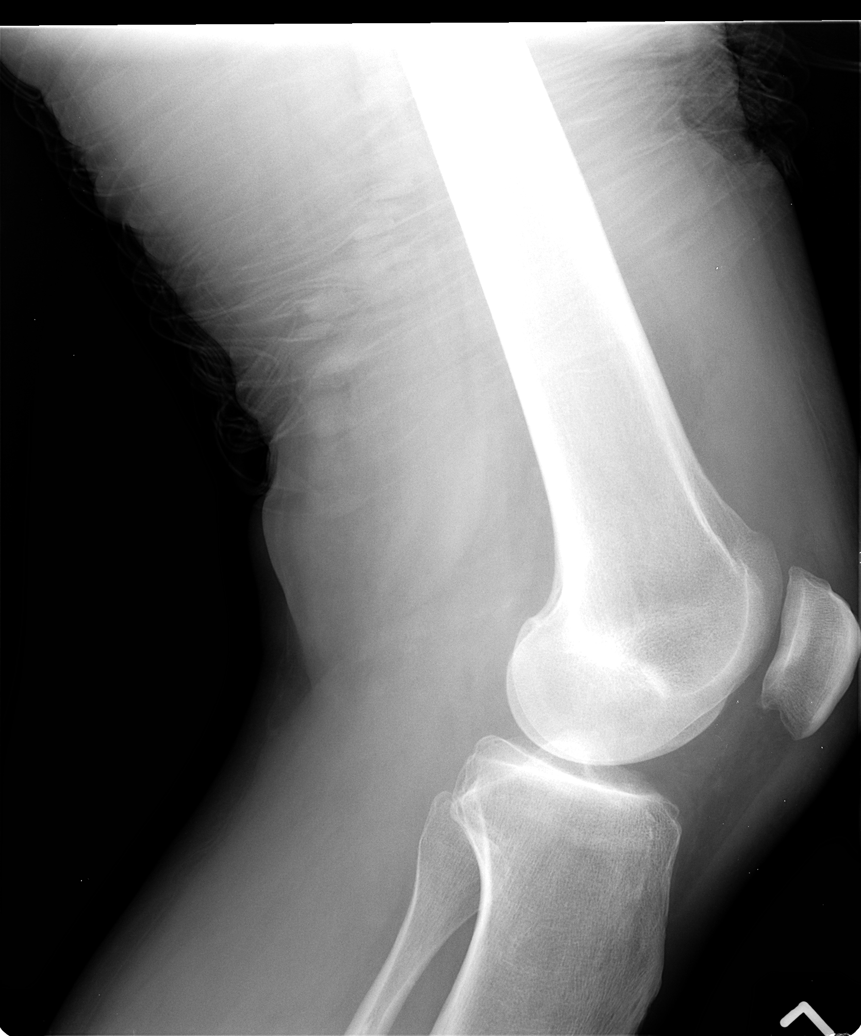

[view not recorded (5 of 5)]
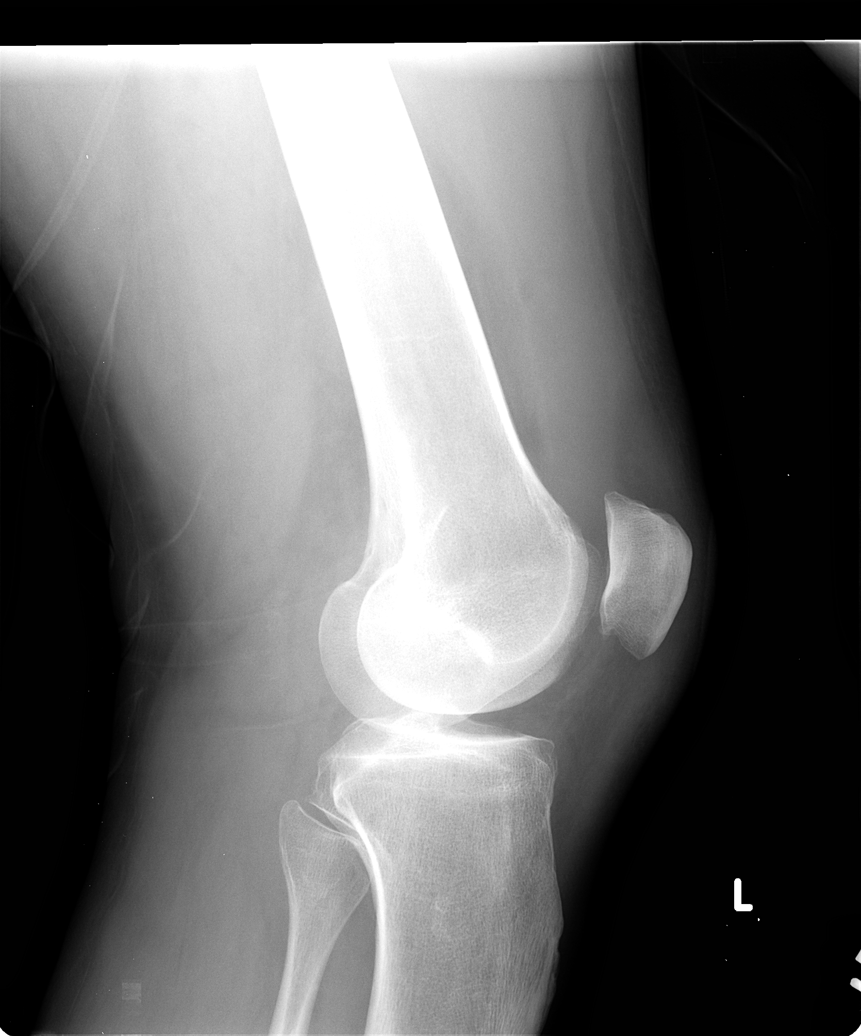

[5 of 5 positions shown; findings below may reference images not displayed]

FINDINGS: Osseous irregularity about the medial metadiaphyseal
region of the proximal tibia may relate to remote medial collateral
ligament insertional injury.  Minimal medial compartment joint
space narrowing and osteophyte formation.  No acute or subacute
fracture identified.  Mild patellofemoral osteoarthritis.  A
probable small suprapatellar joint effusion.
IMPRESSION: 1.  Two compartment osteoarthritis.  Mild.
2.  Question remote medial collateral ligament insertional injury
with osseous irregularity about the proximal medial tibia. Small
exostosis could look similar.
3.  Small suprapatellar joint effusion.

## 2014-03-27 ENCOUNTER — Telehealth: Payer: Self-pay | Admitting: *Deleted

## 2014-03-27 NOTE — Telephone Encounter (Signed)
Pt called stating Dr. Nevada Crane referred him for a colonoscopy, pt wants to schedule that, pt stated he got a letter in the mail also. Please advise [phone] pt said he is in and out if he does not answer LMOM and he will call back.

## 2014-03-28 NOTE — Telephone Encounter (Signed)
I called and LM for pt to return call.

## 2014-03-29 ENCOUNTER — Other Ambulatory Visit: Payer: Self-pay

## 2014-03-29 DIAGNOSIS — Z1211 Encounter for screening for malignant neoplasm of colon: Secondary | ICD-10-CM

## 2014-03-29 NOTE — Addendum Note (Signed)
Addended by: Mahala Menghini on: 03/29/2014 01:11 PM   Modules accepted: Orders

## 2014-03-29 NOTE — Telephone Encounter (Signed)
OK to schedule

## 2014-03-29 NOTE — Telephone Encounter (Signed)
Gastroenterology Pre-Procedure Review  Request Date: 03/29/2014 Requesting Physician: Dr. Wende Neighbors  PATIENT REVIEW QUESTIONS: The patient responded to the following health history questions as indicated:    1. Diabetes Melitis: no 2. Joint replacements in the past 12 months: no 3. Major health problems in the past 3 months: no 4. Has an artificial valve or MVP: no 5. Has a defibrillator: no 6. Has been advised in past to take antibiotics in advance of a procedure like teeth cleaning: no    MEDICATIONS & ALLERGIES:    Patient reports the following regarding taking any blood thinners:   Plavix? no Aspirin? YES Coumadin? no  Patient confirms/reports the following medications:  Current Outpatient Prescriptions  Medication Sig Dispense Refill  . aspirin 81 MG tablet Take 81 mg by mouth daily.      Marland Kitchen atorvastatin (LIPITOR) 40 MG tablet Take 40 mg by mouth at bedtime.      . cholecalciferol (VITAMIN D) 400 UNITS TABS Take 500 Units by mouth.      Marland Kitchen lisinopril (PRINIVIL,ZESTRIL) 20 MG tablet Take 20 mg by mouth daily.      . Multiple Vitamin (MULTIVITAMIN) tablet Take 1 tablet by mouth daily. PT TAKES A MULTIVITAMIN WITH IRON      . naproxen sodium (ANAPROX) 220 MG tablet Take 220 mg by mouth as needed.      . vitamin C (ASCORBIC ACID) 500 MG tablet Take 500 mg by mouth daily.      . vitamin E 400 UNIT capsule Take 400 Units by mouth daily.       No current facility-administered medications for this visit.    Patient confirms/reports the following allergies:  No Known Allergies  No orders of the defined types were placed in this encounter.    AUTHORIZATION INFORMATION Primary Insurance:   ID #:   Group #:  Pre-Cert / Auth required: Pre-Cert / Auth #:   Secondary Insurance:   ID #:   Group #:  Pre-Cert / Auth required:  Pre-Cert / Auth #:   SCHEDULE INFORMATION: Procedure has been scheduled as follows:  Date: 04/24/2014         Time: 8:45 AM Location: [facility]  Short Stay  This Gastroenterology Pre-Precedure Review Form is being routed to the following provider(s): R. Garfield Cornea, MD

## 2014-03-30 MED ORDER — PEG-KCL-NACL-NASULF-NA ASC-C 100 G PO SOLR: 1.0000 | ORAL | Status: DC

## 2014-03-30 NOTE — Telephone Encounter (Signed)
Rx sent to the pharmacy and instructions mailed to pt.  

## 2014-03-30 NOTE — Addendum Note (Signed)
Addended by: Everardo All on: 03/30/2014 02:22 PM   Modules accepted: Orders

## 2014-04-06 ENCOUNTER — Encounter (HOSPITAL_COMMUNITY): Payer: Self-pay | Admitting: Pharmacy Technician

## 2014-04-24 ENCOUNTER — Encounter (HOSPITAL_COMMUNITY): Payer: Self-pay | Admitting: *Deleted

## 2014-04-24 ENCOUNTER — Ambulatory Visit (HOSPITAL_COMMUNITY)
Admission: RE | Admit: 2014-04-24 | Discharge: 2014-04-24 | Disposition: A | Discharge status: Home / Self Care | Payer: Medicare Other | Source: Ambulatory Visit | Attending: Internal Medicine | Admitting: Internal Medicine

## 2014-04-24 ENCOUNTER — Encounter (HOSPITAL_COMMUNITY)
Admission: RE | Disposition: A | Discharge status: Home / Self Care | Payer: Self-pay | Source: Ambulatory Visit | Attending: Internal Medicine

## 2014-04-24 DIAGNOSIS — Z7982 Long term (current) use of aspirin: Secondary | ICD-10-CM | POA: Insufficient documentation

## 2014-04-24 DIAGNOSIS — Z1211 Encounter for screening for malignant neoplasm of colon: Secondary | ICD-10-CM | POA: Insufficient documentation

## 2014-04-24 DIAGNOSIS — Z85038 Personal history of other malignant neoplasm of large intestine: Secondary | ICD-10-CM | POA: Insufficient documentation

## 2014-04-24 DIAGNOSIS — G4730 Sleep apnea, unspecified: Secondary | ICD-10-CM | POA: Insufficient documentation

## 2014-04-24 DIAGNOSIS — I10 Essential (primary) hypertension: Secondary | ICD-10-CM | POA: Insufficient documentation

## 2014-04-24 DIAGNOSIS — E7800 Pure hypercholesterolemia, unspecified: Secondary | ICD-10-CM | POA: Insufficient documentation

## 2014-04-24 HISTORY — DX: Essential (primary) hypertension: I10

## 2014-04-24 HISTORY — DX: Pure hypercholesterolemia, unspecified: E78.00

## 2014-04-24 HISTORY — PX: COLONOSCOPY: SHX5424

## 2014-04-24 SURGERY — COLONOSCOPY
Anesthesia: Moderate Sedation

## 2014-04-24 MED ORDER — MIDAZOLAM HCL 5 MG/5ML IJ SOLN
INTRAMUSCULAR | Status: DC | PRN
  Administered 2014-04-24 (×2): 2 mg via INTRAVENOUS

## 2014-04-24 MED ORDER — ONDANSETRON HCL 4 MG/2ML IJ SOLN
INTRAMUSCULAR | Status: AC
  Filled 2014-04-24: qty 2

## 2014-04-24 MED ORDER — ONDANSETRON HCL 4 MG/2ML IJ SOLN
INTRAMUSCULAR | Status: DC | PRN
  Administered 2014-04-24: 4 mg via INTRAVENOUS

## 2014-04-24 MED ORDER — STERILE WATER FOR IRRIGATION IR SOLN
Status: DC | PRN
  Administered 2014-04-24: 09:00:00

## 2014-04-24 MED ORDER — MIDAZOLAM HCL 5 MG/5ML IJ SOLN
INTRAMUSCULAR | Status: AC
  Filled 2014-04-24: qty 10

## 2014-04-24 MED ORDER — MEPERIDINE HCL 100 MG/ML IJ SOLN
INTRAMUSCULAR | Status: AC
  Filled 2014-04-24: qty 2

## 2014-04-24 MED ORDER — MEPERIDINE HCL 100 MG/ML IJ SOLN
INTRAMUSCULAR | Status: DC | PRN
  Administered 2014-04-24: 25 mg via INTRAVENOUS
  Administered 2014-04-24: 50 mg via INTRAVENOUS

## 2014-04-24 MED ORDER — SODIUM CHLORIDE 0.9 % IV SOLN
INTRAVENOUS | Status: DC
Start: 2014-04-24 — End: 2014-04-24
  Administered 2014-04-24: 1000 mL via INTRAVENOUS

## 2014-04-24 NOTE — Discharge Instructions (Signed)
Colonoscopy °Care After °These instructions give you information on caring for yourself after your procedure. Your doctor may also give you more specific instructions. Call your doctor if you have any problems or questions after your procedure. °HOME CARE °· Take it easy for the next 24 hours. °· Rest. °· Walk or use warm packs on your belly (abdomen) if you have belly cramping or gas. °· Do not drive for 24 hours. °· You may shower. °· Do not sign important papers or use machinery for 24 hours. °· Drink enough fluids to keep your pee (urine) clear or pale yellow. °· Resume your normal diet. Avoid heavy or fried foods. °· Avoid alcohol. °· Continue taking your normal medicines. °· Only take medicine as told by your doctor. Do not take aspirin. °If you had growths (polyps) removed: °· Do not take aspirin. °· Do not drink alcohol for 7 days or as told by your doctor. °· Eat a soft diet for 24 hours. °GET HELP RIGHT AWAY IF: °· You have a fever. °· You pass clumps of tissue (blood clots) or fill the toilet with blood. °· You have belly pain that gets worse and medicine does not help. °· Your belly is puffy (swollen). °· You feel sick to your stomach (nauseous) or throw up (vomit). °MAKE SURE YOU: °· Understand these instructions. °· Will watch your condition. °· Will get help right away if you are not doing well or get worse. °Document Released: 12/06/2010 Document Revised: 01/26/2012 Document Reviewed: 07/11/2013 °ExitCare® Patient Information ©2014 [employer]. ° °

## 2014-04-24 NOTE — H&P (Signed)
_0 @   Primary Care Physician:  Delphina Cahill, MD Primary Gastroenterologist:  Dr. Gala Romney  Pre-Procedure History & Physical: HPI:  Frank [name] is a 68 y.o. male is here for a screening colonoscopy.  Last and first colonoscopy in [place] 10 years ago. It was reportedly negative. No bowel symptoms currently. No family history of colon cancer.  Past Medical History  Diagnosis Date  . Cancer     colon cancer  . Arthritis     left knee  . Sleep apnea     uses cpap  . Hypertension   . Hypercholesteremia     Past Surgical History  Procedure Laterality Date  . Hernia repair      bilateral  . Colon surgery      hx colon ca    Prior to Admission medications   Medication Sig Start Date End Date Taking? Authorizing Provider  aspirin 81 MG tablet Take 81 mg by mouth daily.   Yes Historical Provider, MD  atorvastatin (LIPITOR) 40 MG tablet Take 40 mg by mouth at bedtime.   Yes Historical Provider, MD  cholecalciferol (VITAMIN D) 400 UNITS TABS Take 500 Units by mouth.   Yes Historical Provider, MD  lisinopril (PRINIVIL,ZESTRIL) 20 MG tablet Take 20 mg by mouth daily.   Yes Historical Provider, MD  Multiple Vitamin (MULTIVITAMIN) tablet Take 1 tablet by mouth daily. PT TAKES A MULTIVITAMIN WITH IRON   Yes Historical Provider, MD  naproxen sodium (ALEVE) 220 MG tablet Take 220 mg by mouth 2 (two) times daily as needed (pain).   Yes Historical Provider, MD  peg 3350 powder (MOVIPREP) 100 G SOLR Take 1 kit (200 g total) by mouth as directed. 03/30/14  Yes Daneil Dolin, MD  vitamin C (ASCORBIC ACID) 500 MG tablet Take 500 mg by mouth daily.   Yes Historical Provider, MD    Allergies as of 03/29/2014  . (No Known Allergies)    History reviewed. No pertinent family history.  History   Social History  . Marital Status: Married    Spouse Name: N/A    Number of Children: N/A  . Years of Education: N/A   Occupational History  . Not on file.   Social History Main Topics  .  Smoking status: Never Smoker   . Smokeless tobacco: Not on file  . Alcohol Use: No  . Drug Use: No  . Sexual Activity: Not on file   Other Topics Concern  . Not on file   Social History Narrative  . No narrative on file    Review of Systems: See HPI, otherwise negative ROS  Physical Exam: BP 137/76  Pulse 61  Temp(Src) 97.9 F (36.6 C) (Oral)  SpO2 95% General:   Alert,  Well-developed, well-nourished, pleasant and cooperative in NAD Head:  Normocephalic and atraumatic. Eyes:  Sclera clear, no icterus.   Conjunctiva pink. Ears:  Normal auditory acuity. Nose:  No deformity, discharge,  or lesions. Mouth:  No deformity or lesions, dentition normal. Neck:  Supple; no masses or thyromegaly. Lungs:  Clear throughout to auscultation.   No wheezes, crackles, or rhonchi. No acute distress. Heart:  Regular rate and rhythm; no murmurs, clicks, rubs,  or gallops. Abdomen:  Soft, nontender and nondistended. No masses, hepatosplenomegaly or hernias noted. Normal bowel sounds, without guarding, and without rebound.   Msk:  Symmetrical without gross deformities. Normal posture. Pulses:  Normal pulses noted. Extremities:  Without clubbing or edema. Neurologic:  Alert and  oriented x4;  grossly  normal neurologically. Skin:  Intact without significant lesions or rashes. Cervical Nodes:  No significant cervical adenopathy. Psych:  Alert and cooperative. Normal mood and affect.  Impression/Plan: Frank [name] is now here to undergo a screening colonoscopy.  Average risk screening examination. Risks, benefits, limitations, imponderables and alternatives regarding colonoscopy have been reviewed with the patient. Questions have been answered. All parties agreeable.     Notice:  This dictation was prepared with Dragon dictation along with smaller phrase technology. Any transcriptional errors that result from this process are unintentional and may not be corrected upon review.

## 2014-04-24 NOTE — Op Note (Signed)
[facility] [address] [place], [address]   COLONOSCOPY PROCEDURE REPORT  PATIENT: Frank, [name]  MR#:         [id number] BIRTHDATE: 31-Jul-1946 , 26  yrs. old GENDER: Male ENDOSCOPIST: R.  Garfield Cornea, MD FACP FACG REFERRED BY:  Delphina Cahill, M.D. PROCEDURE DATE:  04/24/2014 PROCEDURE:     Ileocolonoscopy-screening  INDICATIONS: Average risk colorectal cancer screening examination  INFORMED CONSENT:  The risks, benefits, alternatives and imponderables including but not limited to bleeding, perforation as well as the possibility of a missed lesion have been reviewed.  The potential for biopsy, lesion removal, etc. have also been discussed.  Questions have been answered.  All parties agreeable. Please see the history and physical in the medical record for more information.  MEDICATIONS: Versed 4 mg IV and Demerol 75 mg IV in divided doses. Zofran 4 mg IV.  DESCRIPTION OF PROCEDURE:  After a digital rectal exam was performed, the EC-[id number] ([id number])  colonoscope was advanced from the anus through the rectum and colon to the area of the cecum, ileocecal valve and appendiceal orifice.  The cecum was deeply intubated.  These structures were well-seen and photographed for the record.  From the level of the cecum and ileocecal valve, the scope was slowly and cautiously withdrawn.  The mucosal surfaces were carefully surveyed utilizing scope tip deflection to facilitate fold flattening as needed.  The scope was pulled down into the rectum where a thorough examination including retroflexion was performed.    FINDINGS:  Adequate preparation. Normal rectum. Normal-appearing colonic mucosa. Normal-appearing distal 5 cm of terminal ileal mucosa  THERAPEUTIC / DIAGNOSTIC MANEUVERS PERFORMED:  None  COMPLICATIONS: none  CECAL WITHDRAWAL TIME:  8 minutes  IMPRESSION:  Normal ileocolonoscopy  RECOMMENDATIONS: Consider one more screening examination if  overall health permits in 10 years.   _______________________________ eSigned:  R. Garfield Cornea, MD FACP [facility] 04/24/2014 9:35 AM   CC:

## 2014-04-26 ENCOUNTER — Encounter (HOSPITAL_COMMUNITY): Payer: Self-pay | Admitting: Internal Medicine

## 2015-12-29 ENCOUNTER — Emergency Department (HOSPITAL_COMMUNITY): Payer: Medicare Other

## 2015-12-29 ENCOUNTER — Emergency Department (HOSPITAL_COMMUNITY)
Admission: EM | Admit: 2015-12-29 | Discharge: 2015-12-29 | Disposition: A | Discharge status: Home / Self Care | Payer: Medicare Other | Attending: Emergency Medicine | Admitting: Emergency Medicine

## 2015-12-29 ENCOUNTER — Encounter (HOSPITAL_COMMUNITY): Payer: Self-pay | Admitting: Emergency Medicine

## 2015-12-29 DIAGNOSIS — I10 Essential (primary) hypertension: Secondary | ICD-10-CM | POA: Insufficient documentation

## 2015-12-29 DIAGNOSIS — N390 Urinary tract infection, site not specified: Secondary | ICD-10-CM | POA: Insufficient documentation

## 2015-12-29 DIAGNOSIS — R109 Unspecified abdominal pain: Secondary | ICD-10-CM

## 2015-12-29 DIAGNOSIS — G4730 Sleep apnea, unspecified: Secondary | ICD-10-CM | POA: Insufficient documentation

## 2015-12-29 DIAGNOSIS — Z79899 Other long term (current) drug therapy: Secondary | ICD-10-CM | POA: Insufficient documentation

## 2015-12-29 DIAGNOSIS — Z85038 Personal history of other malignant neoplasm of large intestine: Secondary | ICD-10-CM | POA: Insufficient documentation

## 2015-12-29 DIAGNOSIS — R300 Dysuria: Secondary | ICD-10-CM | POA: Diagnosis present

## 2015-12-29 DIAGNOSIS — Z7982 Long term (current) use of aspirin: Secondary | ICD-10-CM | POA: Diagnosis not present

## 2015-12-29 DIAGNOSIS — E7800 Pure hypercholesterolemia, unspecified: Secondary | ICD-10-CM | POA: Diagnosis not present

## 2015-12-29 DIAGNOSIS — Z9981 Dependence on supplemental oxygen: Secondary | ICD-10-CM | POA: Diagnosis not present

## 2015-12-29 DIAGNOSIS — N132 Hydronephrosis with renal and ureteral calculous obstruction: Secondary | ICD-10-CM | POA: Insufficient documentation

## 2015-12-29 DIAGNOSIS — M1990 Unspecified osteoarthritis, unspecified site: Secondary | ICD-10-CM | POA: Insufficient documentation

## 2015-12-29 LAB — COMPREHENSIVE METABOLIC PANEL WITH GFR
ALT: 18 U/L (ref 17–63)
AST: 22 U/L (ref 15–41)
Albumin: 4.2 g/dL (ref 3.5–5.0)
Alkaline Phosphatase: 78 U/L (ref 38–126)
Anion gap: 9 (ref 5–15)
BUN: 19 mg/dL (ref 6–20)
CO2: 25 mmol/L (ref 22–32)
Calcium: 9.5 mg/dL (ref 8.9–10.3)
Chloride: 106 mmol/L (ref 101–111)
Creatinine, Ser: 1.18 mg/dL (ref 0.61–1.24)
GFR calc Af Amer: >60 mL/min (ref >60)
GFR calc non Af Amer: >60 mL/min (ref >60)
Glucose, Bld: 136 mg/dL — ABNORMAL HIGH (ref 65–99)
Potassium: 4.4 mmol/L (ref 3.5–5.1)
Sodium: 140 mmol/L (ref 135–145)
Total Bilirubin: 1.2 mg/dL (ref 0.3–1.2)
Total Protein: 7.2 g/dL (ref 6.5–8.1)

## 2015-12-29 LAB — CBC WITH DIFFERENTIAL/PLATELET
Basophils Absolute: 0 K/uL (ref 0.0–0.1)
Basophils Relative: 0 %
Eosinophils Absolute: 0 K/uL (ref 0.0–0.7)
Eosinophils Relative: 0 %
HCT: 39.3 % (ref 39.0–52.0)
Hemoglobin: 12.7 g/dL — ABNORMAL LOW (ref 13.0–17.0)
Lymphocytes Relative: 2 %
Lymphs Abs: 0.3 K/uL — ABNORMAL LOW (ref 0.7–4.0)
MCH: 30 pg (ref 26.0–34.0)
MCHC: 32.3 g/dL (ref 30.0–36.0)
MCV: 92.7 fL (ref 78.0–100.0)
Monocytes Absolute: 0.6 K/uL (ref 0.1–1.0)
Monocytes Relative: 3 %
Neutro Abs: 15.8 K/uL — ABNORMAL HIGH (ref 1.7–7.7)
Neutrophils Relative %: 95 %
Platelets: 262 K/uL (ref 150–400)
RBC: 4.24 MIL/uL (ref 4.22–5.81)
RDW: 12.3 % (ref 11.5–15.5)
WBC: 16.6 K/uL — ABNORMAL HIGH (ref 4.0–10.5)

## 2015-12-29 LAB — URINALYSIS, ROUTINE W REFLEX MICROSCOPIC
Bilirubin Urine: NEGATIVE
Glucose, UA: NEGATIVE mg/dL
Ketones, ur: NEGATIVE mg/dL
Leukocytes, UA: NEGATIVE
Nitrite: NEGATIVE
Protein, ur: 100 mg/dL — AB
Specific Gravity, Urine: 1.025 (ref 1.005–1.030)
pH: 5.5 (ref 5.0–8.0)

## 2015-12-29 LAB — URINE MICROSCOPIC-ADD ON

## 2015-12-29 LAB — LIPASE, BLOOD: Lipase: 27 U/L (ref 11–51)

## 2015-12-29 MED ORDER — ONDANSETRON HCL 4 MG/2ML IJ SOLN
4.0000 mg | Freq: Once | INTRAMUSCULAR | Status: AC
  Administered 2015-12-29: 4 mg via INTRAVENOUS
  Filled 2015-12-29: qty 2

## 2015-12-29 MED ORDER — SODIUM CHLORIDE 0.9 % IV SOLN
INTRAVENOUS | Status: DC
  Administered 2015-12-29: 16:00:00 via INTRAVENOUS

## 2015-12-29 MED ORDER — HYDROMORPHONE HCL 1 MG/ML IJ SOLN
0.5000 mg | INTRAMUSCULAR | Status: DC | PRN
  Administered 2015-12-29: 0.5 mg via INTRAVENOUS
  Filled 2015-12-29: qty 1

## 2015-12-29 MED ORDER — ONDANSETRON 8 MG PO TBDP: 8.0000 mg | ORAL_TABLET | Freq: Three times a day (TID) | ORAL | Status: DC | PRN

## 2015-12-29 MED ORDER — SODIUM CHLORIDE 0.9 % IV BOLUS (SEPSIS)
1000.0000 mL | Freq: Once | INTRAVENOUS | Status: AC
  Administered 2015-12-29: 1000 mL via INTRAVENOUS

## 2015-12-29 MED ORDER — TAMSULOSIN HCL 0.4 MG PO CAPS: 0.4000 mg | ORAL_CAPSULE | Freq: Every day | ORAL | Status: DC

## 2015-12-29 MED ORDER — CEFTRIAXONE SODIUM 1 G IJ SOLR
1.0000 g | Freq: Once | INTRAMUSCULAR | Status: AC
  Administered 2015-12-29: 1 g via INTRAVENOUS
  Filled 2015-12-29: qty 10

## 2015-12-29 MED ORDER — CEPHALEXIN 500 MG PO CAPS: 500.0000 mg | ORAL_CAPSULE | Freq: Four times a day (QID) | ORAL | Status: DC

## 2015-12-29 MED ORDER — OXYCODONE-ACETAMINOPHEN 5-325 MG PO TABS: 1.0000 | ORAL_TABLET | Freq: Four times a day (QID) | ORAL | Status: DC | PRN

## 2015-12-29 NOTE — ED Notes (Signed)
Pt states that he woke up this morning with left flank pain going into groin with nausea/vomiting.  Also burning with urination and only a little comes out.

## 2015-12-29 NOTE — ED Provider Notes (Signed)
CSN: [id number]     Arrival date & time 12/29/15  1320 History   First MD Initiated Contact with Patient 12/29/15 1411     Chief Complaint  Patient presents with  . Flank Pain  . Dysuria    Patient is a 70 y.o. male presenting with flank pain and dysuria. The history is provided by the patient.  Flank Pain This is a new problem. Episode onset: started this am. The problem occurs constantly. The problem has not changed since onset.Associated symptoms include abdominal pain. Pertinent negatives include no chest pain, no headaches and no shortness of breath.  Dysuria This is a new problem. Associated symptoms include abdominal pain. Pertinent negatives include no chest pain, no headaches and no shortness of breath. Exacerbated by: urination.  Sx started this am.  Pain is in the llq and flank.  It burns to urinate.  The pain in the flank is burning.  Urinary frequency and urgency.  He has felt nauseated.  No fever.  He vomited 3-4 times since it started.  Past Medical History  Diagnosis Date  . Cancer [facility])     colon cancer  . Arthritis     left knee  . Sleep apnea     uses cpap  . Hypertension   . Hypercholesteremia    Past Surgical History  Procedure Laterality Date  . Hernia repair      bilateral  . Colon surgery      hx colon ca  . Colonoscopy N/A 04/24/2014    Procedure: COLONOSCOPY;  Surgeon: Daneil Dolin, MD;  Location: [facility];  Service: Endoscopy;  Laterality: N/A;  8:45 AM   History reviewed. No pertinent family history. Social History  Substance Use Topics  . Smoking status: Never Smoker   . Smokeless tobacco: None  . Alcohol Use: No    Review of Systems  Respiratory: Negative for shortness of breath.   Cardiovascular: Negative for chest pain.  Gastrointestinal: Positive for abdominal pain.  Genitourinary: Positive for dysuria and flank pain.  Neurological: Negative for headaches.  All other systems reviewed and are negative.     Allergies  Review  of patient's allergies indicates no known allergies.  Home Medications   Prior to Admission medications   Medication Sig Start Date End Date Taking? Authorizing Provider  aspirin 81 MG tablet Take 81 mg by mouth daily.   Yes Historical Provider, MD  atorvastatin (LIPITOR) 40 MG tablet Take 40 mg by mouth at bedtime.   Yes Historical Provider, MD  lisinopril (PRINIVIL,ZESTRIL) 20 MG tablet Take 20 mg by mouth daily.   Yes Historical Provider, MD  Multiple Vitamin (MULTIVITAMIN) tablet Take 1 tablet by mouth daily. PT TAKES A MULTIVITAMIN WITH IRON   Yes Historical Provider, MD  naproxen sodium (ALEVE) 220 MG tablet Take 220 mg by mouth 2 (two) times daily as needed (pain).   Yes Historical Provider, MD  vitamin C (ASCORBIC ACID) 500 MG tablet Take 500 mg by mouth daily.   Yes Historical Provider, MD  cephALEXin (KEFLEX) 500 MG capsule Take 1 capsule (500 mg total) by mouth 4 (four) times daily. 12/29/15   Dorie Rank, MD  ondansetron (ZOFRAN ODT) 8 MG disintegrating tablet Take 1 tablet (8 mg total) by mouth every 8 (eight) hours as needed for nausea or vomiting. 12/29/15   Dorie Rank, MD  oxyCODONE-acetaminophen (PERCOCET/ROXICET) 5-325 MG tablet Take 1-2 tablets by mouth every 6 (six) hours as needed. 12/29/15   Dorie Rank, MD  peg  3350 powder (MOVIPREP) 100 G SOLR Take 1 kit (200 g total) by mouth as directed. 03/30/14   Daneil Dolin, MD  tamsulosin (FLOMAX) 0.4 MG CAPS capsule Take 1 capsule (0.4 mg total) by mouth daily. 12/29/15   Dorie Rank, MD   BP 140/77 mmHg  Pulse 105  Temp(Src) 99.9 F (37.7 C) (Oral)  Resp 16  Ht _0  (1.88 m)  Wt 81.647 kg  BMI 23.10 kg/m2  SpO2 92% Physical Exam  Constitutional: He appears well-developed and well-nourished. No distress.  HENT:  Head: Normocephalic and atraumatic.  Right Ear: External ear normal.  Left Ear: External ear normal.  Eyes: Conjunctivae are normal. Right eye exhibits no discharge. Left eye exhibits no discharge. No scleral icterus.   Neck: Neck supple. No tracheal deviation present.  Cardiovascular: Normal rate, regular rhythm and intact distal pulses.   Pulmonary/Chest: Effort normal and breath sounds normal. No stridor. No respiratory distress. He has no wheezes. He has no rales.  Abdominal: Soft. Bowel sounds are normal. He exhibits no distension. There is tenderness in the left lower quadrant. There is no rigidity, no rebound and no guarding. No hernia.  Musculoskeletal: He exhibits no edema or tenderness.  Neurological: He is alert. He has normal strength. No cranial nerve deficit (no facial droop, extraocular movements intact, no slurred speech) or sensory deficit. He exhibits normal muscle tone. He displays no seizure activity. Coordination normal.  Skin: Skin is warm and dry. No rash noted.  Psychiatric: He has a normal mood and affect.  Nursing note and vitals reviewed.   ED Course  Procedures (including critical care time) Labs Review Labs Reviewed  URINALYSIS, ROUTINE W REFLEX MICROSCOPIC (NOT AT [facility]) - Abnormal; Notable for the following:    APPearance CLOUDY (*)    Hgb urine dipstick LARGE (*)    Protein, ur 100 (*)    All other components within normal limits  COMPREHENSIVE METABOLIC PANEL - Abnormal; Notable for the following:    Glucose, Bld 136 (*)    All other components within normal limits  CBC WITH DIFFERENTIAL/PLATELET - Abnormal; Notable for the following:    WBC 16.6 (*)    Hemoglobin 12.7 (*)    Neutro Abs 15.8 (*)    Lymphs Abs 0.3 (*)    All other components within normal limits  URINE MICROSCOPIC-ADD ON - Abnormal; Notable for the following:    Squamous Epithelial / LPF 6-30 (*)    Bacteria, UA FEW (*)    All other components within normal limits  LIPASE, BLOOD    Imaging Review Ct Renal Stone Study  12/29/2015  CLINICAL DATA:  70 year old acute onset of left flank pain radiating into the left groin, nausea and vomiting, dysuria, and diminished urinary output. Personal history  of colon cancer. EXAM: CT ABDOMEN AND PELVIS WITHOUT CONTRAST TECHNIQUE: Multidetector CT imaging of the abdomen and pelvis was performed following the standard protocol without IV contrast. COMPARISON:  None. FINDINGS: Lower chest: Normal heart size. Visualized lung bases clear apart from minimal scarring in the lower lobes. Hepatobiliary: Normal unenhanced appearance of the liver. Gallbladder normal in appearance without calcified gallstones. No biliary ductal dilation. Pancreas: Normal unenhanced appearance. Spleen: Normal unenhanced appearance. Adrenals/Urinary Tract: Mild bilateral adrenal enlargement without nodularity. Numerous bilateral renal calculi. Two adjacent calculi in the distal left ureter at the UVJ, the larger distal calculus measuring approximately 5 x 6 mm and the smaller proximal calculus measuring approximately 3 x 4 mm. These cause marked obstruction of the  left urinary tract with moderate left hydronephrosis, severe left renal edema and severe left perinephric edema. No obstructing right ureteral calculus. Within the limits of the unenhanced technique, no focal parenchymal abnormality involving either kidney. Normal-appearing decompressed urinary bladder. Stomach/Bowel: Small hiatal hernia. Stomach otherwise normal in appearance. Normal-appearing small bowel. Moderate to large stool burden in the cecum and ascending colon. Remainder the colon decompressed and unremarkable. Cecum extends low in the right side of the pelvis. Normal-appearing gas-filled appendix in the right low pelvis. 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 No pathologic lymphadenopathy. Reproductive: Prior prostatectomy. Seminal vesicles normal in appearance. No evidence recurrent tumor at the prostatectomy site. Other: Right inguinal hernia containing fat. Musculoskeletal: Multilevel degenerative disc disease and spondylosis involving the lower thoracic and lumbar spine.  Mild degenerative changes involving both hips. Degenerative grade 1 spondylolisthesis of L4 on L5. No acute abnormality. IMPRESSION: 1. Two adjacent distal left ureteral calculi, the larger calculus at the UVJ measuring approximately 5 x 6 mm and accounting for severe left urinary tract obstruction. The smaller calculus measures approximately 3 x 4 mm. 2. Multiple bilateral nonobstructing renal calculi. 3. Small hiatal hernia. 4. Right inguinal hernia containing fat. 5. 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 Electronically Signed   By: Evangeline Dakin M.D.   On: 12/29/2015 17:26   I have personally reviewed and evaluated these images and lab results as part of my medical decision-making.  Medications  sodium chloride 0.9 % bolus 1,000 mL (0 mLs Intravenous Stopped 12/29/15 1601)    And  0.9 %  sodium chloride infusion ( Intravenous New Bag/Given 12/29/15 1606)  HYDROmorphone (DILAUDID) injection 0.5 mg (0.5 mg Intravenous Given 12/29/15 1449)  ondansetron (ZOFRAN) injection 4 mg (4 mg Intravenous Given 12/29/15 1449)  cefTRIAXone (ROCEPHIN) 1 g in dextrose 5 % 50 mL IVPB (1 g Intravenous New Bag/Given 12/29/15 1751)     MDM   Final diagnoses:  Ureteral stone with hydronephrosis  UTI (lower urinary tract infection)   Pt presented with left flank pain.  Ua suggests possible infection.  WBC elevated.  Pt remains afebrile.  CT scan shows a ureteral stone with obstruction.  Will start IV abx.  Consult with urology.  Discussed with Dr Tresa Moore.  [place] for close outpatient follow up.  STrict precautions to return if he develops fever or feels worse.  Pt is feeling better and would like to go home.  Dc home with keflex, zofran, percocet and flomax    Dorie Rank, MD 12/29/15 [id number]

## 2015-12-29 NOTE — Discharge Instructions (Signed)
Kidney Stones °Kidney stones (urolithiasis) are deposits that form inside your kidneys. The intense pain is caused by the stone moving through the urinary tract. When the stone moves, the ureter goes into spasm around the stone. The stone is usually passed in the urine.  °CAUSES  °· A disorder that makes certain neck glands produce too much parathyroid hormone (primary hyperparathyroidism). °· A buildup of uric acid crystals, similar to gout in your joints. °· Narrowing (stricture) of the ureter. °· A kidney obstruction present at birth (congenital obstruction). °· Previous surgery on the kidney or ureters. °· Numerous kidney infections. °SYMPTOMS  °· Feeling sick to your stomach (nauseous). °· Throwing up (vomiting). °· Blood in the urine (hematuria). °· Pain that usually spreads (radiates) to the groin. °· Frequency or urgency of urination. °DIAGNOSIS  °· Taking a history and physical exam. °· Blood or urine tests. °· CT scan. °· Occasionally, an examination of the inside of the urinary bladder (cystoscopy) is performed. °TREATMENT  °· Observation. °· Increasing your fluid intake. °· Extracorporeal shock wave lithotripsy--This is a noninvasive procedure that uses shock waves to break up kidney stones. °· Surgery may be needed if you have severe pain or persistent obstruction. There are various surgical procedures. Most of the procedures are performed with the use of small instruments. Only small incisions are needed to accommodate these instruments, so recovery time is minimized. °The size, location, and chemical composition are all important variables that will determine the proper choice of action for you. Talk to your health care provider to better understand your situation so that you will minimize the risk of injury to yourself and your kidney.  °HOME CARE INSTRUCTIONS  °· Drink enough water and fluids to keep your urine clear or pale yellow. This will help you to pass the stone or stone fragments. °· Strain  all urine through the provided strainer. Keep all particulate matter and stones for your health care provider to see. The stone causing the pain may be as small as a grain of salt. It is very important to use the strainer each and every time you pass your urine. The collection of your stone will allow your health care provider to analyze it and verify that a stone has actually passed. The stone analysis will often identify what you can do to reduce the incidence of recurrences. °· Only take over-the-counter or prescription medicines for pain, discomfort, or fever as directed by your health care provider. °· Keep all follow-up visits as told by your health care provider. This is important. °· Get follow-up X-rays if required. The absence of pain does not always mean that the stone has passed. It may have only stopped moving. If the urine remains completely obstructed, it can cause loss of kidney function or even complete destruction of the kidney. It is your responsibility to make sure X-rays and follow-ups are completed. Ultrasounds of the kidney can show blockages and the status of the kidney. Ultrasounds are not associated with any radiation and can be performed easily in a matter of minutes. °· Make changes to your daily diet as told by your health care provider. You may be told to: °¨ Limit the amount of salt that you eat. °¨ Eat 5 or more servings of fruits and vegetables each day. °¨ Limit the amount of meat, poultry, fish, and eggs that you eat. °· Collect a 24-hour urine sample as told by your health care provider. You may need to collect another urine sample every 6-12   months. °SEEK MEDICAL CARE IF: °· You experience pain that is progressive and unresponsive to any pain medicine you have been prescribed. °SEEK IMMEDIATE MEDICAL CARE IF:  °· Pain cannot be controlled with the prescribed medicine. °· You have a fever or shaking chills. °· The severity or intensity of pain increases over 18 hours and is not  relieved by pain medicine. °· You develop a new onset of abdominal pain. °· You feel faint or pass out. °· You are unable to urinate. °  °This information is not intended to replace advice given to you by your health care provider. Make sure you discuss any questions you have with your health care provider. °  °Document Released: 11/03/2005 Document Revised: 07/25/2015 Document Reviewed: 04/06/2013 °Elsevier Interactive Patient Education ©2016 [employer] ° °

## 2015-12-29 NOTE — ED Notes (Signed)
Dr. Knapp at bedside updating patient and family. 

## 2017-01-07 ENCOUNTER — Other Ambulatory Visit: Payer: Self-pay | Admitting: Orthopedic Surgery

## 2017-01-08 ENCOUNTER — Other Ambulatory Visit: Payer: Self-pay

## 2017-01-13 ENCOUNTER — Encounter: Payer: Self-pay | Admitting: Vascular Surgery

## 2017-01-20 ENCOUNTER — Ambulatory Visit (INDEPENDENT_AMBULATORY_CARE_PROVIDER_SITE_OTHER): Payer: Medicare Other | Admitting: Vascular Surgery

## 2017-01-20 ENCOUNTER — Encounter: Payer: Self-pay | Admitting: Vascular Surgery

## 2017-01-20 VITALS — BP 133/84 | HR 62 | Temp 97.7°F | Resp 16 | Ht 74.0 in | Wt 183.0 lb

## 2017-01-20 DIAGNOSIS — M5137 Other intervertebral disc degeneration, lumbosacral region: Secondary | ICD-10-CM | POA: Diagnosis not present

## 2017-01-20 DIAGNOSIS — M51379 Other intervertebral disc degeneration, lumbosacral region without mention of lumbar back pain or lower extremity pain: Secondary | ICD-10-CM

## 2017-01-20 NOTE — Progress Notes (Signed)
[facility] [place]  Patient name: Frank [name] MRN: [id number] DOB: 1946-06-04 Sex: male  REASON FOR CONSULT: Discuss anterior exposure for lumbar disc surgery  HPI: Frank [name] is a 71 y.o. male, who is seen today for discussion of the anterior exposure for lumbar disc surgery. He is here today with his wife. He has had progressive difficulty over several years with pain in his back and also pain radiating down both legs. His had failure of conservative treatment options and is now being scheduled for multilevel back surgery. He is seeing me to discuss anterior approach for the lower level of the fusion required. Current plan is to have anterior approach 1 day and posterior approach show 1-2 days later. He's had no prior intra-abdominal surgery. Has had bilateral hernia repair in the past. Denies any history of cardiac disease. Does have sleep apnea  Past Medical History:  Diagnosis Date  . Arthritis    left knee  . Cancer [facility])    colon cancer  . Hypercholesteremia   . Hypertension   . Sleep apnea    uses cpap    No family history on file.  SOCIAL HISTORY: Social History   Social History  . Marital status: Married    Spouse name: N/A  . Number of children: N/A  . Years of education: N/A   Occupational History  . Not on file.   Social History Main Topics  . Smoking status: Never Smoker  . Smokeless tobacco: Former Systems developer  . Alcohol use No  . Drug use: No  . Sexual activity: Not on file   Other Topics Concern  . Not on file   Social History Narrative  . No narrative on file    No Known Allergies  Current Outpatient Prescriptions  Medication Sig Dispense Refill  . aspirin 81 MG tablet Take 81 mg by mouth daily.    Marland Kitchen atorvastatin (LIPITOR) 40 MG tablet Take 40 mg by mouth at bedtime.    . cephALEXin (KEFLEX) 500 MG capsule Take 1 capsule (500 mg total) by mouth 4 (four) times daily. 28 capsule 0    . lisinopril (PRINIVIL,ZESTRIL) 20 MG tablet Take 20 mg by mouth daily.    . Multiple Vitamin (MULTIVITAMIN) tablet Take 1 tablet by mouth daily. PT TAKES A MULTIVITAMIN WITH IRON    . naproxen sodium (ALEVE) 220 MG tablet Take 220 mg by mouth 2 (two) times daily as needed (pain).    . ondansetron (ZOFRAN ODT) 8 MG disintegrating tablet Take 1 tablet (8 mg total) by mouth every 8 (eight) hours as needed for nausea or vomiting. 12 tablet 0  . oxyCODONE-acetaminophen (PERCOCET/ROXICET) 5-325 MG tablet Take 1-2 tablets by mouth every 6 (six) hours as needed. 20 tablet 0  . peg 3350 powder (MOVIPREP) 100 G SOLR Take 1 kit (200 g total) by mouth as directed. 1 kit 0  . tamsulosin (FLOMAX) 0.4 MG CAPS capsule Take 1 capsule (0.4 mg total) by mouth daily. 7 capsule 0  . vitamin C (ASCORBIC ACID) 500 MG tablet Take 500 mg by mouth daily.     No current facility-administered medications for this visit.     REVIEW OF SYSTEMS:  _0  denotes positive finding, _1  denotes negative finding Cardiac  Comments:  Chest pain or chest pressure:    Shortness of breath upon exertion:    Short of breath when lying flat:    Irregular heart rhythm:  Vascular    Pain in calf, thigh, or hip brought on by ambulation: x Neurogenic   Pain in feet at night that wakes you up from your sleep:  x Neurogenic   Blood clot in your veins:    Leg swelling:         Pulmonary    Oxygen at home:    Productive cough:     Wheezing:         Neurologic    Sudden weakness in arms or legs:  x   Sudden numbness in arms or legs:  x   Sudden onset of difficulty speaking or slurred speech:    Temporary loss of vision in one eye:     Problems with dizziness:         Gastrointestinal    Blood in stool:     Vomited blood:         Genitourinary    Burning when urinating:     Blood in urine:        Psychiatric    Major depression:         Hematologic    Bleeding problems:    Problems with blood clotting too easily:         Skin    Rashes or ulcers:        Constitutional    Fever or chills:      PHYSICAL EXAM: Vitals:   01/20/17 1106  BP: 133/84  Pulse: 62  Resp: 16  Temp: 97.7 F (36.5 C)  TempSrc: Oral  SpO2: 99%  Weight: 183 lb (83 kg)  Height: _0  (1.88 m)    GENERAL: The patient is a well-nourished male, in no acute distress. The vital signs are documented above. CARDIOVASCULAR: 2+ radial and 2+ posterior tibial pulses. Carotid arteries without bruits PULMONARY: There is good air exchange  ABDOMEN: Soft and non-tender  MUSCULOSKELETAL: There are no major deformities or cyanosis. NEUROLOGIC: No focal weakness or paresthesias are detected. SKIN: There are no ulcers or rashes noted. PSYCHIATRIC: The patient has a normal affect.  DATA:  I reviewed a CT of his abdomen from February 2017. This was to evaluate possible kidney stones. This shows extensive calcification most particularly at his aortic bifurcation and is common iliac arteries bilaterally.  MEDICAL ISSUES: Had long discussion with patient and his wife present. Explained my role for anterior exposure. Explain mobilization of the intraperitoneal contents, left ureter and overlying arterial and venous structures of potential injury for all of these. 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 I discussed this with the patient also will discuss this with Dr.Dumonski. I'd feel that it would be safe to address the L5-S1 disc from the anterior approach feel that his iliac calcification would make L4-5 at high risk for arterial injury. Will make final plans after further discussion   Rosetta Posner, MD [facility] Tel [phone] Pager [phone]

## 2017-01-27 ENCOUNTER — Encounter (HOSPITAL_COMMUNITY): Payer: Self-pay

## 2017-01-27 NOTE — Pre-Procedure Instructions (Addendum)
JLON BETKER  01/27/2017      [id number] - [place], [place] - [address] [place] [address] Phone: [phone] Fax: [phone]    Your procedure is scheduled on Wednesday March 21.  Report to [facility] Admitting at 6:30 A.M.  Call this number if you have problems the morning of surgery:  [phone]   Remember:  Do not eat food or drink liquids after midnight.  Take these medicines the morning of surgery with A SIP OF WATER: NONE  7 days prior to surgery STOP taking any Aspirin, meloxicam (mobic), Aleve, Naproxen, Ibuprofen, Motrin, Advil, Goody's, BC's, all herbal medications, fish oil, and all vitamins    Do not wear jewelry, make-up or nail polish.  Do not wear lotions, powders, or perfumes, or deoderant.  Do not shave 48 hours prior to surgery.  Men may shave face and neck.  Do not bring valuables to the hospital.  [facility] is not responsible for any belongings or valuables.  Contacts, dentures or bridgework may not be worn into surgery.  Leave your suitcase in the car.  After surgery it may be brought to your room.  For patients admitted to the hospital, discharge time will be determined by your treatment team.  Patients discharged the day of surgery will not be allowed to drive home.    Special instructions:    - Preparing For Surgery  Before surgery, you can play an important role. Because skin is not sterile, your skin needs to be as free of germs as possible. You can reduce the number of germs on your skin by washing with CHG (chlorahexidine gluconate) Soap before surgery.  CHG is an antiseptic cleaner which kills germs and bonds with the skin to continue killing germs even after washing.  Please do not use if you have an allergy to CHG or antibacterial soaps. If your skin becomes reddened/irritated stop using the CHG.  Do not shave (including legs and underarms) for at least 48  hours prior to first CHG shower. It is OK to shave your face.  Please follow these instructions carefully.   1. Shower the NIGHT BEFORE SURGERY and the MORNING OF SURGERY with CHG.   2. If you chose to wash your hair, wash your hair first as usual with your normal shampoo.  3. After you shampoo, rinse your hair and body thoroughly to remove the shampoo.  4. Use CHG as you would any other liquid soap. You can apply CHG directly to the skin and wash gently with a scrungie or a clean washcloth.   5. Apply the CHG Soap to your body ONLY FROM THE NECK DOWN.  Do not use on open wounds or open sores. Avoid contact with your eyes, ears, mouth and genitals (private parts). Wash genitals (private parts) with your normal soap.  6. Wash thoroughly, paying special attention to the area where your surgery will be performed.  7. Thoroughly rinse your body with warm water from the neck down.  8. DO NOT shower/wash with your normal soap after using and rinsing off the CHG Soap.  9. Pat yourself dry with a CLEAN TOWEL.   10. Wear CLEAN PAJAMAS   11. Place CLEAN SHEETS on your bed the night of your first shower and DO NOT SLEEP WITH PETS.    Day of Surgery: Do not apply any deodorants/lotions. Please wear clean clothes to the hospital/surgery center.  Please read over the following fact sheets that you were given. MRSA Information

## 2017-01-28 ENCOUNTER — Encounter (HOSPITAL_COMMUNITY)
Admission: RE | Admit: 2017-01-28 | Discharge: 2017-01-28 | Disposition: A | Discharge status: Home / Self Care | Payer: Medicare Other | Source: Ambulatory Visit | Attending: Orthopedic Surgery | Admitting: Orthopedic Surgery

## 2017-01-28 ENCOUNTER — Encounter (HOSPITAL_COMMUNITY): Payer: Self-pay

## 2017-01-28 DIAGNOSIS — Z79891 Long term (current) use of opiate analgesic: Secondary | ICD-10-CM | POA: Insufficient documentation

## 2017-01-28 DIAGNOSIS — Z01810 Encounter for preprocedural cardiovascular examination: Secondary | ICD-10-CM | POA: Diagnosis present

## 2017-01-28 DIAGNOSIS — Z01812 Encounter for preprocedural laboratory examination: Secondary | ICD-10-CM | POA: Insufficient documentation

## 2017-01-28 DIAGNOSIS — M1712 Unilateral primary osteoarthritis, left knee: Secondary | ICD-10-CM | POA: Diagnosis not present

## 2017-01-28 DIAGNOSIS — Z79899 Other long term (current) drug therapy: Secondary | ICD-10-CM | POA: Diagnosis not present

## 2017-01-28 DIAGNOSIS — I10 Essential (primary) hypertension: Secondary | ICD-10-CM | POA: Diagnosis not present

## 2017-01-28 DIAGNOSIS — M549 Dorsalgia, unspecified: Secondary | ICD-10-CM | POA: Insufficient documentation

## 2017-01-28 DIAGNOSIS — Z85038 Personal history of other malignant neoplasm of large intestine: Secondary | ICD-10-CM | POA: Diagnosis not present

## 2017-01-28 DIAGNOSIS — Z7982 Long term (current) use of aspirin: Secondary | ICD-10-CM | POA: Diagnosis not present

## 2017-01-28 HISTORY — DX: Personal history of urinary calculi: Z87.442

## 2017-01-28 LAB — URINALYSIS, MICROSCOPIC (REFLEX)

## 2017-01-28 LAB — COMPREHENSIVE METABOLIC PANEL WITH GFR
ALT: 15 U/L — ABNORMAL LOW (ref 17–63)
AST: 20 U/L (ref 15–41)
Albumin: 4 g/dL (ref 3.5–5.0)
Alkaline Phosphatase: 69 U/L (ref 38–126)
Anion gap: 4 — ABNORMAL LOW (ref 5–15)
BUN: 13 mg/dL (ref 6–20)
CO2: 25 mmol/L (ref 22–32)
Calcium: 9.4 mg/dL (ref 8.9–10.3)
Chloride: 110 mmol/L (ref 101–111)
Creatinine, Ser: 0.87 mg/dL (ref 0.61–1.24)
GFR calc Af Amer: >60 mL/min (ref >60)
GFR calc non Af Amer: >60 mL/min (ref >60)
Glucose, Bld: 105 mg/dL — ABNORMAL HIGH (ref 65–99)
Potassium: 4.4 mmol/L (ref 3.5–5.1)
Sodium: 139 mmol/L (ref 135–145)
Total Bilirubin: 0.9 mg/dL (ref 0.3–1.2)
Total Protein: 7 g/dL (ref 6.5–8.1)

## 2017-01-28 LAB — TYPE AND SCREEN
ABO/RH(D): B POS
Antibody Screen: NEGATIVE

## 2017-01-28 LAB — CBC WITH DIFFERENTIAL/PLATELET
Basophils Absolute: 0 K/uL (ref 0.0–0.1)
Basophils Relative: 0 %
Eosinophils Absolute: 0.1 K/uL (ref 0.0–0.7)
Eosinophils Relative: 2 %
HCT: 39.4 % (ref 39.0–52.0)
Hemoglobin: 12.5 g/dL — ABNORMAL LOW (ref 13.0–17.0)
Lymphocytes Relative: 34 %
Lymphs Abs: 1.6 K/uL (ref 0.7–4.0)
MCH: 29.3 pg (ref 26.0–34.0)
MCHC: 31.7 g/dL (ref 30.0–36.0)
MCV: 92.5 fL (ref 78.0–100.0)
Monocytes Absolute: 0.5 K/uL (ref 0.1–1.0)
Monocytes Relative: 11 %
Neutro Abs: 2.6 K/uL (ref 1.7–7.7)
Neutrophils Relative %: 53 %
Platelets: 300 K/uL (ref 150–400)
RBC: 4.26 MIL/uL (ref 4.22–5.81)
RDW: 12.9 % (ref 11.5–15.5)
WBC: 4.9 K/uL (ref 4.0–10.5)

## 2017-01-28 LAB — PROTIME-INR
INR: 1.05
Prothrombin Time: 13.7 s (ref 11.4–15.2)

## 2017-01-28 LAB — URINALYSIS, ROUTINE W REFLEX MICROSCOPIC
Bilirubin Urine: NEGATIVE
Glucose, UA: NEGATIVE mg/dL
Hgb urine dipstick: NEGATIVE
Ketones, ur: NEGATIVE mg/dL
Nitrite: POSITIVE — AB
Protein, ur: NEGATIVE mg/dL
Specific Gravity, Urine: 1.02 (ref 1.005–1.030)
pH: 6 (ref 5.0–8.0)

## 2017-01-28 LAB — ABO/RH: ABO/RH(D): B POS

## 2017-01-28 LAB — SURGICAL PCR SCREEN
MRSA, PCR: NEGATIVE
Staphylococcus aureus: NEGATIVE

## 2017-01-28 LAB — APTT: aPTT: 30 s (ref 24–36)

## 2017-01-28 MED ORDER — CHLORHEXIDINE GLUCONATE 4 % EX LIQD: 60.0000 mL | Freq: Once | CUTANEOUS | Status: DC

## 2017-01-28 NOTE — Progress Notes (Signed)
PCP: Dr. Nevada Crane   Cardiologist: pt denies  ECG: pt denies past year  Stress test: pt denies   ECHO : Pt denies  Cardiac Cath: Pt denies    Chest x-ray: pt denies past year

## 2017-02-03 NOTE — H&P (Signed)
PREOPERATIVE H&P  Chief Complaint: Bilateral leg pain  HPI: Frank [name] is a 71 y.o. male who presents with ongoing pain in the bilateral legs x 2 years  Patient's imaging reveals spinal stenosis involving L4/5, L5/S1, as well as a degenerative scoli spanning L2-S1  Patient has failed multiple forms of conservative care and continues to have pain (see office notes for additional details regarding the patient's full course of treatment)  Past Medical History:  Diagnosis Date  . Arthritis    left knee  . Cancer [facility])    colon cancer  . History of kidney stones   . Hypercholesteremia   . Hypertension   . Sleep apnea    uses cpap   Past Surgical History:  Procedure Laterality Date  . COLON SURGERY     hx colon ca  . COLONOSCOPY N/A 04/24/2014   Procedure: COLONOSCOPY;  Surgeon: Daneil Dolin, MD;  Location: [facility] SUITE;  Service: Endoscopy;  Laterality: N/A;  8:45 AM  . HERNIA REPAIR     bilateral   Social History   Social History  . Marital status: Married    Spouse name: N/A  . Number of children: N/A  . Years of education: N/A   Social History Main Topics  . Smoking status: Never Smoker  . Smokeless tobacco: Never Used  . Alcohol use No  . Drug use: No  . Sexual activity: Not on file   Other Topics Concern  . Not on file   Social History Narrative  . No narrative on file   No family history on file. No Known Allergies Prior to Admission medications   Medication Sig Start Date End Date Taking? Authorizing Provider  aspirin 81 MG tablet Take 81 mg by mouth daily.    Historical Provider, MD  atorvastatin (LIPITOR) 40 MG tablet Take 40 mg by mouth at bedtime.    Historical Provider, MD  cephALEXin (KEFLEX) 500 MG capsule Take 1 capsule (500 mg total) by mouth 4 (four) times daily. Patient not taking: Reported on 01/26/2017 12/29/15   Dorie Rank, MD  cyanocobalamin 500 MCG tablet Take 500 mcg by mouth daily.    Historical Provider, MD  lisinopril  (PRINIVIL,ZESTRIL) 20 MG tablet Take 20 mg by mouth daily.    Historical Provider, MD  meloxicam (MOBIC) 15 MG tablet Take 15 mg by mouth daily.    Historical Provider, MD  Multiple Vitamin (MULTIVITAMIN) tablet Take 1 tablet by mouth daily. PT TAKES A MULTIVITAMIN WITH IRON    Historical Provider, MD  ondansetron (ZOFRAN ODT) 8 MG disintegrating tablet Take 1 tablet (8 mg total) by mouth every 8 (eight) hours as needed for nausea or vomiting. Patient not taking: Reported on 01/26/2017 12/29/15   Dorie Rank, MD  oxyCODONE-acetaminophen (PERCOCET/ROXICET) 5-325 MG tablet Take 1-2 tablets by mouth every 6 (six) hours as needed. Patient not taking: Reported on 01/26/2017 12/29/15   Dorie Rank, MD  peg 3350 powder (MOVIPREP) 100 G SOLR Take 1 kit (200 g total) by mouth as directed. Patient not taking: Reported on 01/26/2017 03/30/14   Daneil Dolin, MD  tamsulosin (FLOMAX) 0.4 MG CAPS capsule Take 1 capsule (0.4 mg total) by mouth daily. Patient not taking: Reported on 01/26/2017 12/29/15   Dorie Rank, MD  vitamin C (ASCORBIC ACID) 500 MG tablet Take 500 mg by mouth daily.    Historical Provider, MD     All other systems have been reviewed and were otherwise negative with the  exception of those mentioned in the HPI and as above.  Physical Exam: There were no vitals filed for this visit.  General: Alert, no acute distress Cardiovascular: No pedal edema Respiratory: No cyanosis, no use of accessory musculature Skin: No lesions in the area of chief complaint Neurologic: Sensation intact distally Psychiatric: Patient is competent for consent with normal mood and affect Lymphatic: No axillary or cervical lymphadenopathy  MUSCULOSKELETAL: Very forward-flexed gait noted with ambulation  Assessment/Plan: Bilateral leg pain Plan for Procedure(s): LUMBAR 5-SACRUM 1 ANTERIOR LUMBAR FUSION, WITH POSSIBLE LATERAL INTERBODY FUSION AT L2/3, L3/4, AND L4/5 WITH INSTRUMENTATION AND ALLOGRAFT. OF NOTE, DR. EARLY IS  NOT ABLE TO SAFELY APPROACH THE L4-5 LEVEL ANTERIORLY.  I THEREFORE WILL ATTEMPT TO APPROACH L4-5 LATERALLY.  IF THIS IS NOT POSSIBLE TO BE DONE SAFELY, A POSTERIOR L4-5 FUSION MAY BE NEEDED DURING STAGE 2 OF THE PATIENT'S PROCEDURE.   Sinclair Ship, MD 02/03/2017 8:06 AM

## 2017-02-04 ENCOUNTER — Encounter (HOSPITAL_COMMUNITY): Payer: Self-pay | Admitting: *Deleted

## 2017-02-04 ENCOUNTER — Inpatient Hospital Stay (HOSPITAL_COMMUNITY): Payer: Medicare Other

## 2017-02-04 ENCOUNTER — Inpatient Hospital Stay (HOSPITAL_COMMUNITY): Payer: Medicare Other | Admitting: Anesthesiology

## 2017-02-04 ENCOUNTER — Encounter (HOSPITAL_COMMUNITY)
Admission: RE | Disposition: A | Discharge status: Home Health | Payer: Self-pay | Source: Ambulatory Visit | Attending: Orthopedic Surgery

## 2017-02-04 ENCOUNTER — Inpatient Hospital Stay (HOSPITAL_COMMUNITY)
Admission: RE | Admit: 2017-02-04 | Discharge: 2017-02-07 | DRG: 455 | Disposition: A | Discharge status: Home Health | Payer: Medicare Other | Source: Ambulatory Visit | Attending: Orthopedic Surgery | Admitting: Orthopedic Surgery

## 2017-02-04 DIAGNOSIS — M4186 Other forms of scoliosis, lumbar region: Principal | ICD-10-CM | POA: Diagnosis present

## 2017-02-04 DIAGNOSIS — Z85038 Personal history of other malignant neoplasm of large intestine: Secondary | ICD-10-CM

## 2017-02-04 DIAGNOSIS — M48061 Spinal stenosis, lumbar region without neurogenic claudication: Secondary | ICD-10-CM | POA: Diagnosis present

## 2017-02-04 DIAGNOSIS — Z79899 Other long term (current) drug therapy: Secondary | ICD-10-CM | POA: Diagnosis not present

## 2017-02-04 DIAGNOSIS — G4730 Sleep apnea, unspecified: Secondary | ICD-10-CM | POA: Diagnosis present

## 2017-02-04 DIAGNOSIS — M4807 Spinal stenosis, lumbosacral region: Secondary | ICD-10-CM | POA: Diagnosis present

## 2017-02-04 DIAGNOSIS — Z419 Encounter for procedure for purposes other than remedying health state, unspecified: Secondary | ICD-10-CM

## 2017-02-04 DIAGNOSIS — I10 Essential (primary) hypertension: Secondary | ICD-10-CM | POA: Diagnosis present

## 2017-02-04 DIAGNOSIS — M5416 Radiculopathy, lumbar region: Secondary | ICD-10-CM | POA: Diagnosis present

## 2017-02-04 DIAGNOSIS — M5136 Other intervertebral disc degeneration, lumbar region: Secondary | ICD-10-CM | POA: Diagnosis not present

## 2017-02-04 DIAGNOSIS — Z01818 Encounter for other preprocedural examination: Secondary | ICD-10-CM

## 2017-02-04 DIAGNOSIS — E7800 Pure hypercholesterolemia, unspecified: Secondary | ICD-10-CM | POA: Diagnosis present

## 2017-02-04 DIAGNOSIS — M5137 Other intervertebral disc degeneration, lumbosacral region: Secondary | ICD-10-CM | POA: Diagnosis not present

## 2017-02-04 HISTORY — PX: ABDOMINAL EXPOSURE: SHX5708

## 2017-02-04 HISTORY — PX: ANTERIOR LAT LUMBAR FUSION: SHX1168

## 2017-02-04 HISTORY — PX: ANTERIOR LUMBAR FUSION: SHX1170

## 2017-02-04 SURGERY — ANTERIOR LUMBAR FUSION 1 LEVEL
Anesthesia: General | Laterality: Right

## 2017-02-04 MED ORDER — OXYCODONE-ACETAMINOPHEN 5-325 MG PO TABS
1.0000 | ORAL_TABLET | ORAL | Status: DC | PRN
  Administered 2017-02-05: 2 via ORAL
  Administered 2017-02-06: 1 via ORAL
  Administered 2017-02-06 – 2017-02-07 (×4): 2 via ORAL
  Filled 2017-02-04 (×6): qty 2

## 2017-02-04 MED ORDER — CEFAZOLIN SODIUM 1 G IJ SOLR
INTRAMUSCULAR | Status: AC
  Filled 2017-02-04: qty 20

## 2017-02-04 MED ORDER — DOCUSATE SODIUM 100 MG PO CAPS
100.0000 mg | ORAL_CAPSULE | Freq: Two times a day (BID) | ORAL | Status: DC
  Administered 2017-02-04 – 2017-02-07 (×5): 100 mg via ORAL
  Filled 2017-02-04 (×5): qty 1

## 2017-02-04 MED ORDER — ESMOLOL HCL 100 MG/10ML IV SOLN
INTRAVENOUS | Status: DC | PRN
  Administered 2017-02-04: 60 mg via INTRAVENOUS

## 2017-02-04 MED ORDER — 0.9 % SODIUM CHLORIDE (POUR BTL) OPTIME
TOPICAL | Status: DC | PRN
  Administered 2017-02-04 (×4): 1000 mL

## 2017-02-04 MED ORDER — METOCLOPRAMIDE HCL 5 MG/ML IJ SOLN
INTRAMUSCULAR | Status: AC
  Filled 2017-02-04: qty 2

## 2017-02-04 MED ORDER — CEFAZOLIN SODIUM-DEXTROSE 2-4 GM/100ML-% IV SOLN
2.0000 g | INTRAVENOUS | Status: AC
  Administered 2017-02-04 (×3): 2 g via INTRAVENOUS
  Filled 2017-02-04: qty 100

## 2017-02-04 MED ORDER — CEFAZOLIN SODIUM-DEXTROSE 2-4 GM/100ML-% IV SOLN
2.0000 g | Freq: Three times a day (TID) | INTRAVENOUS | Status: AC
  Administered 2017-02-05 (×3): 2 g via INTRAVENOUS
  Filled 2017-02-04 (×2): qty 100

## 2017-02-04 MED ORDER — THROMBIN 20000 UNITS EX SOLR
CUTANEOUS | Status: AC
  Filled 2017-02-04: qty 20000

## 2017-02-04 MED ORDER — ONDANSETRON HCL 4 MG/2ML IJ SOLN
INTRAMUSCULAR | Status: AC
  Filled 2017-02-04: qty 2

## 2017-02-04 MED ORDER — THROMBIN 20000 UNITS EX KIT
PACK | CUTANEOUS | Status: DC | PRN
  Administered 2017-02-04: 20000 [IU] via TOPICAL

## 2017-02-04 MED ORDER — MENTHOL 3 MG MT LOZG: 1.0000 | LOZENGE | OROMUCOSAL | Status: DC | PRN

## 2017-02-04 MED ORDER — CEFAZOLIN SODIUM-DEXTROSE 2-4 GM/100ML-% IV SOLN: 2.0000 g | INTRAVENOUS | Status: DC

## 2017-02-04 MED ORDER — ADULT MULTIVITAMIN W/MINERALS CH
1.0000 | ORAL_TABLET | Freq: Every day | ORAL | Status: DC
  Administered 2017-02-06 – 2017-02-07 (×2): 1 via ORAL
  Filled 2017-02-04 (×2): qty 1

## 2017-02-04 MED ORDER — FLEET ENEMA 7-19 GM/118ML RE ENEM: 1.0000 | ENEMA | Freq: Once | RECTAL | Status: DC | PRN

## 2017-02-04 MED ORDER — EPHEDRINE 5 MG/ML INJ
INTRAVENOUS | Status: AC
  Filled 2017-02-04: qty 10

## 2017-02-04 MED ORDER — HYDROMORPHONE HCL 1 MG/ML IJ SOLN: 0.2500 mg | INTRAMUSCULAR | Status: DC | PRN

## 2017-02-04 MED ORDER — LIDOCAINE 2% (20 MG/ML) 5 ML SYRINGE
INTRAMUSCULAR | Status: AC
  Filled 2017-02-04: qty 5

## 2017-02-04 MED ORDER — SENNOSIDES-DOCUSATE SODIUM 8.6-50 MG PO TABS: 1.0000 | ORAL_TABLET | Freq: Every evening | ORAL | Status: DC | PRN

## 2017-02-04 MED ORDER — HEMOSTATIC AGENTS (NO CHARGE) OPTIME
TOPICAL | Status: DC | PRN
  Administered 2017-02-04: 1 via TOPICAL

## 2017-02-04 MED ORDER — EPHEDRINE SULFATE 50 MG/ML IJ SOLN
INTRAMUSCULAR | Status: DC | PRN
  Administered 2017-02-04: 10 mg via INTRAVENOUS

## 2017-02-04 MED ORDER — ARTIFICIAL TEARS OP OINT
TOPICAL_OINTMENT | OPHTHALMIC | Status: DC | PRN
  Administered 2017-02-04: 1 via OPHTHALMIC

## 2017-02-04 MED ORDER — DIAZEPAM 5 MG PO TABS: 5.0000 mg | ORAL_TABLET | Freq: Four times a day (QID) | ORAL | Status: DC | PRN

## 2017-02-04 MED ORDER — SODIUM CHLORIDE 0.9% FLUSH: 3.0000 mL | INTRAVENOUS | Status: DC | PRN

## 2017-02-04 MED ORDER — VITAMIN B-12 100 MCG PO TABS
500.0000 ug | ORAL_TABLET | Freq: Every day | ORAL | Status: DC
  Administered 2017-02-06 – 2017-02-07 (×2): 500 ug via ORAL
  Filled 2017-02-04 (×2): qty 5

## 2017-02-04 MED ORDER — SODIUM CHLORIDE 0.9% FLUSH
3.0000 mL | Freq: Two times a day (BID) | INTRAVENOUS | Status: DC
  Administered 2017-02-06: 3 mL via INTRAVENOUS

## 2017-02-04 MED ORDER — ROCURONIUM BROMIDE 100 MG/10ML IV SOLN
INTRAVENOUS | Status: DC | PRN
  Administered 2017-02-04: 50 mg via INTRAVENOUS

## 2017-02-04 MED ORDER — BUPIVACAINE-EPINEPHRINE 0.25% -1:200000 IJ SOLN
INTRAMUSCULAR | Status: DC | PRN
  Administered 2017-02-04: 4 mL

## 2017-02-04 MED ORDER — FENTANYL CITRATE (PF) 100 MCG/2ML IJ SOLN
INTRAMUSCULAR | Status: DC | PRN
  Administered 2017-02-04 (×7): 50 ug via INTRAVENOUS
  Administered 2017-02-04: 100 ug via INTRAVENOUS
  Administered 2017-02-04: 50 ug via INTRAVENOUS
  Administered 2017-02-04: 150 ug via INTRAVENOUS
  Administered 2017-02-04: 50 ug via INTRAVENOUS

## 2017-02-04 MED ORDER — PHENYLEPHRINE 40 MCG/ML (10ML) SYRINGE FOR IV PUSH (FOR BLOOD PRESSURE SUPPORT)
PREFILLED_SYRINGE | INTRAVENOUS | Status: AC
  Filled 2017-02-04: qty 10

## 2017-02-04 MED ORDER — ZOLPIDEM TARTRATE 5 MG PO TABS: 5.0000 mg | ORAL_TABLET | Freq: Every evening | ORAL | Status: DC | PRN

## 2017-02-04 MED ORDER — FENTANYL CITRATE (PF) 100 MCG/2ML IJ SOLN
INTRAMUSCULAR | Status: AC
  Filled 2017-02-04: qty 10

## 2017-02-04 MED ORDER — PHENYLEPHRINE HCL 10 MG/ML IJ SOLN
INTRAMUSCULAR | Status: DC | PRN
  Administered 2017-02-04: 20 ug/min via INTRAVENOUS

## 2017-02-04 MED ORDER — ESMOLOL HCL 100 MG/10ML IV SOLN
INTRAVENOUS | Status: AC
  Filled 2017-02-04: qty 10

## 2017-02-04 MED ORDER — DEXAMETHASONE SODIUM PHOSPHATE 10 MG/ML IJ SOLN
INTRAMUSCULAR | Status: AC
  Filled 2017-02-04: qty 1

## 2017-02-04 MED ORDER — ACETAMINOPHEN 650 MG RE SUPP: 650.0000 mg | RECTAL | Status: DC | PRN

## 2017-02-04 MED ORDER — SUGAMMADEX SODIUM 200 MG/2ML IV SOLN
INTRAVENOUS | Status: DC | PRN
  Administered 2017-02-04: 100 mg via INTRAVENOUS

## 2017-02-04 MED ORDER — LIDOCAINE HCL (CARDIAC) 20 MG/ML IV SOLN
INTRAVENOUS | Status: DC | PRN
  Administered 2017-02-04: 60 mg via INTRAVENOUS

## 2017-02-04 MED ORDER — ROCURONIUM BROMIDE 50 MG/5ML IV SOSY
PREFILLED_SYRINGE | INTRAVENOUS | Status: AC
  Filled 2017-02-04: qty 5

## 2017-02-04 MED ORDER — PROPOFOL 500 MG/50ML IV EMUL
INTRAVENOUS | Status: DC | PRN
  Administered 2017-02-04: 50 ug/kg/min via INTRAVENOUS

## 2017-02-04 MED ORDER — PROPOFOL 10 MG/ML IV BOLUS
INTRAVENOUS | Status: DC | PRN
  Administered 2017-02-04: 200 mg via INTRAVENOUS

## 2017-02-04 MED ORDER — BISACODYL 5 MG PO TBEC: 5.0000 mg | DELAYED_RELEASE_TABLET | Freq: Every day | ORAL | Status: DC | PRN

## 2017-02-04 MED ORDER — DEXTROSE 5 % IV SOLN
INTRAVENOUS | Status: DC | PRN
  Administered 2017-02-04: 09:00:00 via INTRAVENOUS

## 2017-02-04 MED ORDER — PROMETHAZINE HCL 25 MG/ML IJ SOLN: 6.2500 mg | INTRAMUSCULAR | Status: DC | PRN

## 2017-02-04 MED ORDER — MIDAZOLAM HCL 2 MG/2ML IJ SOLN
INTRAMUSCULAR | Status: AC
  Filled 2017-02-04: qty 2

## 2017-02-04 MED ORDER — PANTOPRAZOLE SODIUM 40 MG IV SOLR
40.0000 mg | Freq: Every day | INTRAVENOUS | Status: DC
  Administered 2017-02-04 – 2017-02-05 (×2): 40 mg via INTRAVENOUS
  Filled 2017-02-04 (×2): qty 40

## 2017-02-04 MED ORDER — POVIDONE-IODINE 7.5 % EX SOLN: Freq: Once | CUTANEOUS | Status: DC

## 2017-02-04 MED ORDER — ONDANSETRON HCL 4 MG/2ML IJ SOLN
INTRAMUSCULAR | Status: DC | PRN
  Administered 2017-02-04 (×2): 4 mg via INTRAVENOUS

## 2017-02-04 MED ORDER — BUPIVACAINE HCL (PF) 0.25 % IJ SOLN
INTRAMUSCULAR | Status: AC
  Filled 2017-02-04: qty 30

## 2017-02-04 MED ORDER — ATORVASTATIN CALCIUM 40 MG PO TABS
40.0000 mg | ORAL_TABLET | Freq: Every day | ORAL | Status: DC
  Administered 2017-02-04 – 2017-02-06 (×3): 40 mg via ORAL
  Filled 2017-02-04 (×3): qty 1

## 2017-02-04 MED ORDER — LISINOPRIL 20 MG PO TABS
20.0000 mg | ORAL_TABLET | Freq: Every day | ORAL | Status: DC
  Administered 2017-02-04 – 2017-02-07 (×3): 20 mg via ORAL
  Filled 2017-02-04 (×3): qty 1

## 2017-02-04 MED ORDER — METOCLOPRAMIDE HCL 5 MG/ML IJ SOLN
INTRAMUSCULAR | Status: DC | PRN
  Administered 2017-02-04: 10 mg via INTRAVENOUS

## 2017-02-04 MED ORDER — VECURONIUM BROMIDE 10 MG IV SOLR
INTRAVENOUS | Status: DC | PRN
  Administered 2017-02-04: 4 mg via INTRAVENOUS
  Administered 2017-02-04 (×3): 2 mg via INTRAVENOUS

## 2017-02-04 MED ORDER — ALBUMIN HUMAN 5 % IV SOLN
INTRAVENOUS | Status: DC | PRN
  Administered 2017-02-04: 10:00:00 via INTRAVENOUS

## 2017-02-04 MED ORDER — HYDROMORPHONE HCL 1 MG/ML IJ SOLN
INTRAMUSCULAR | Status: AC
  Filled 2017-02-04: qty 1

## 2017-02-04 MED ORDER — FENTANYL CITRATE (PF) 100 MCG/2ML IJ SOLN
INTRAMUSCULAR | Status: AC
  Filled 2017-02-04: qty 4

## 2017-02-04 MED ORDER — PHENOL 1.4 % MT LIQD: 1.0000 | OROMUCOSAL | Status: DC | PRN

## 2017-02-04 MED ORDER — DEXAMETHASONE SODIUM PHOSPHATE 10 MG/ML IJ SOLN
INTRAMUSCULAR | Status: DC | PRN
  Administered 2017-02-04: 10 mg via INTRAVENOUS

## 2017-02-04 MED ORDER — SODIUM CHLORIDE 0.9 % IV SOLN: 250.0000 mL | INTRAVENOUS | Status: DC

## 2017-02-04 MED ORDER — MIDAZOLAM HCL 5 MG/5ML IJ SOLN
INTRAMUSCULAR | Status: DC | PRN
  Administered 2017-02-04 (×2): 1 mg via INTRAVENOUS

## 2017-02-04 MED ORDER — BUPIVACAINE LIPOSOME 1.3 % IJ SUSP
20.0000 mL | INTRAMUSCULAR | Status: DC
  Filled 2017-02-04: qty 20

## 2017-02-04 MED ORDER — ONDANSETRON HCL 4 MG/2ML IJ SOLN: 4.0000 mg | Freq: Four times a day (QID) | INTRAMUSCULAR | Status: DC | PRN

## 2017-02-04 MED ORDER — PROPOFOL 10 MG/ML IV BOLUS
INTRAVENOUS | Status: AC
  Filled 2017-02-04: qty 20

## 2017-02-04 MED ORDER — LACTATED RINGERS IV SOLN
INTRAVENOUS | Status: DC | PRN
  Administered 2017-02-04 (×4): via INTRAVENOUS

## 2017-02-04 MED ORDER — ONDANSETRON HCL 4 MG PO TABS: 4.0000 mg | ORAL_TABLET | Freq: Four times a day (QID) | ORAL | Status: DC | PRN

## 2017-02-04 MED ORDER — EPINEPHRINE PF 1 MG/ML IJ SOLN
INTRAMUSCULAR | Status: AC
  Filled 2017-02-04: qty 1

## 2017-02-04 MED ORDER — MORPHINE SULFATE (PF) 2 MG/ML IV SOLN
1.0000 mg | INTRAVENOUS | Status: DC | PRN
  Administered 2017-02-04: 1 mg via INTRAVENOUS
  Administered 2017-02-05: 2 mg via INTRAVENOUS
  Filled 2017-02-04 (×2): qty 1

## 2017-02-04 MED ORDER — VITAMIN C 500 MG PO TABS
500.0000 mg | ORAL_TABLET | Freq: Every day | ORAL | Status: DC
  Administered 2017-02-04 – 2017-02-07 (×3): 500 mg via ORAL
  Filled 2017-02-04 (×3): qty 1

## 2017-02-04 MED ORDER — ACETAMINOPHEN 325 MG PO TABS: 650.0000 mg | ORAL_TABLET | ORAL | Status: DC | PRN

## 2017-02-04 MED ORDER — POTASSIUM CHLORIDE IN NACL 20-0.9 MEQ/L-% IV SOLN
INTRAVENOUS | Status: DC
Start: 2017-02-04 — End: 2017-02-05
  Administered 2017-02-04: 21:00:00 via INTRAVENOUS
  Filled 2017-02-04 (×2): qty 1000

## 2017-02-04 MED ORDER — ALUM & MAG HYDROXIDE-SIMETH 200-200-20 MG/5ML PO SUSP
30.0000 mL | Freq: Four times a day (QID) | ORAL | Status: DC | PRN
Start: 2017-02-04 — End: 2017-02-07

## 2017-02-04 MED ORDER — ARTIFICIAL TEARS OP OINT
TOPICAL_OINTMENT | OPHTHALMIC | Status: AC
Start: 2017-02-04 — End: 2017-02-04
  Filled 2017-02-04: qty 3.5

## 2017-02-04 MED ORDER — SUGAMMADEX SODIUM 200 MG/2ML IV SOLN
INTRAVENOUS | Status: AC
Start: 2017-02-04 — End: 2017-02-04
  Filled 2017-02-04: qty 2

## 2017-02-04 MED FILL — Heparin Sodium (Porcine) Inj 1000 Unit/ML: INTRAMUSCULAR | Qty: 30 | Status: AC

## 2017-02-04 MED FILL — Sodium Chloride IV Soln 0.9%: INTRAVENOUS | Qty: 1000 | Status: AC

## 2017-02-04 SURGICAL SUPPLY — 112 items
APPLIER CLIP 11 MED OPEN (CLIP) ×4
BENZOIN TINCTURE PRP APPL 2/3 (GAUZE/BANDAGES/DRESSINGS) ×4 IMPLANT
BLADE CLIPPER SURG (BLADE) IMPLANT
BLADE SURG 10 STRL SS (BLADE) ×4 IMPLANT
BONE MATRIX VIVIGEN 16.CC/XL (Bone Implant) ×4 IMPLANT
BONE VIVIGEN FORMABLE 5.4CC (Bone Implant) ×4 IMPLANT
CAGE COUGAR ALIP MED 12M 10DEG (Cage) ×4 IMPLANT
CLIP APPLIE 11 MED OPEN (CLIP) ×2 IMPLANT
CLIP LIGATING EXTRA MED SLVR (CLIP) ×4 IMPLANT
CLIP LIGATING EXTRA SM BLUE (MISCELLANEOUS) ×4 IMPLANT
CLOSURE WOUND 1/2 X4 (GAUZE/BANDAGES/DRESSINGS) ×2
CORDS BIPOLAR (ELECTRODE) ×4 IMPLANT
COVER BACK TABLE 80X110 HD (DRAPES) ×4 IMPLANT
COVER MAYO STAND STRL (DRAPES) ×4 IMPLANT
COVER SURGICAL LIGHT HANDLE (MISCELLANEOUS) ×4 IMPLANT
DERMABOND ADVANCED (GAUZE/BANDAGES/DRESSINGS) ×2
DERMABOND ADVANCED .7 DNX12 (GAUZE/BANDAGES/DRESSINGS) ×2 IMPLANT
DRAPE C-ARM 42X72 X-RAY (DRAPES) ×8 IMPLANT
DRAPE C-ARMOR (DRAPES) ×2 IMPLANT
DRAPE INCISE IOBAN 66X45 STRL (DRAPES) ×2 IMPLANT
DRAPE POUCH INSTRU U-SHP 10X18 (DRAPES) ×4 IMPLANT
DRAPE SURG 17X23 STRL (DRAPES) ×16 IMPLANT
DRSG MEPILEX BORDER 4X12 (GAUZE/BANDAGES/DRESSINGS) ×4 IMPLANT
DRSG MEPILEX BORDER 4X8 (GAUZE/BANDAGES/DRESSINGS) ×2 IMPLANT
DURAPREP 26ML APPLICATOR (WOUND CARE) ×4 IMPLANT
ELECT BLADE 4.0 EZ CLEAN MEGAD (MISCELLANEOUS) ×8
ELECT BLADE 6.5 EXT (BLADE) ×4 IMPLANT
ELECT CAUTERY BLADE 6.4 (BLADE) ×6 IMPLANT
ELECT REM PT RETURN 9FT ADLT (ELECTROSURGICAL) ×4
ELECTRODE BLDE 4.0 EZ CLN MEGD (MISCELLANEOUS) ×4 IMPLANT
ELECTRODE REM PT RTRN 9FT ADLT (ELECTROSURGICAL) ×2 IMPLANT
GAUZE SPONGE 4X4 16PLY XRAY LF (GAUZE/BANDAGES/DRESSINGS) ×6 IMPLANT
GLOVE BIO SURGEON STRL SZ7 (GLOVE) ×10 IMPLANT
GLOVE BIO SURGEON STRL SZ8 (GLOVE) ×8 IMPLANT
GLOVE BIOGEL PI IND STRL 7.0 (GLOVE) ×2 IMPLANT
GLOVE BIOGEL PI IND STRL 8 (GLOVE) ×4 IMPLANT
GLOVE BIOGEL PI INDICATOR 7.0 (GLOVE) ×4
GLOVE BIOGEL PI INDICATOR 8 (GLOVE) ×8
GLOVE SS BIOGEL STRL SZ 7.5 (GLOVE) ×2 IMPLANT
GLOVE SUPERSENSE BIOGEL SZ 7.5 (GLOVE) ×2
GLOVE SURG SS PI 6.5 STRL IVOR (GLOVE) ×2 IMPLANT
GOWN STRL REUS W/ TWL LRG LVL3 (GOWN DISPOSABLE) ×6 IMPLANT
GOWN STRL REUS W/ TWL XL LVL3 (GOWN DISPOSABLE) ×4 IMPLANT
GOWN STRL REUS W/TWL LRG LVL3 (GOWN DISPOSABLE) ×14
GOWN STRL REUS W/TWL XL LVL3 (GOWN DISPOSABLE) ×8
GRAFT BNE MATRIX VG FRMBL MD 5 (Bone Implant) IMPLANT
GRAFT BNE MATRIX VG XL 16 (Bone Implant) IMPLANT
HEMOSTAT SURGICEL 2X14 (HEMOSTASIS) IMPLANT
IMPL COROENT XL 12X18X55 IMPLANT
IMPLANT COROENT XL 12X18X55 ×8 IMPLANT
INSERT FOGARTY 61MM (MISCELLANEOUS) IMPLANT
INSERT FOGARTY SM (MISCELLANEOUS) IMPLANT
KIT BASIN OR (CUSTOM PROCEDURE TRAY) ×4 IMPLANT
KIT DILATOR XLIF 5 (KITS) IMPLANT
KIT ROOM TURNOVER OR (KITS) ×4 IMPLANT
KIT SURGICAL ACCESS MAXCESS 4 (KITS) ×2 IMPLANT
KIT XLIF (KITS) ×2
LOOP VESSEL MAXI BLUE (MISCELLANEOUS) IMPLANT
LOOP VESSEL MINI RED (MISCELLANEOUS) IMPLANT
MARKER SKIN DUAL TIP RULER LAB (MISCELLANEOUS) ×4 IMPLANT
MODULE EMG NDL SSEP NVM5 (NEEDLE) IMPLANT
MODULE EMG NEEDLE SSEP NVM5 (NEEDLE) ×4 IMPLANT
MODULE NVM5 NEXT GEN EMG (NEEDLE) ×2 IMPLANT
NDL HYPO 25GX1X1/2 BEV (NEEDLE) ×2 IMPLANT
NDL SPNL 18GX3.5 QUINCKE PK (NEEDLE) ×2 IMPLANT
NEEDLE HYPO 25GX1X1/2 BEV (NEEDLE) ×8 IMPLANT
NEEDLE SPNL 18GX3.5 QUINCKE PK (NEEDLE) ×4 IMPLANT
NS IRRIG 1000ML POUR BTL (IV SOLUTION) ×4 IMPLANT
PACK LAMINECTOMY ORTHO (CUSTOM PROCEDURE TRAY) ×4 IMPLANT
PACK UNIVERSAL I (CUSTOM PROCEDURE TRAY) ×6 IMPLANT
PAD ARMBOARD 7.5X6 YLW CONV (MISCELLANEOUS) ×16 IMPLANT
PENCIL BUTTON HOLSTER BLD 10FT (ELECTRODE) ×2 IMPLANT
PUTTY BONE DBX 2.5 MIS (Bone Implant) ×2 IMPLANT
SLEEVE SURGEON STRL (DRAPES) ×2 IMPLANT
SPONGE INTESTINAL PEANUT (DISPOSABLE) ×16 IMPLANT
SPONGE LAP 18X18 X RAY DECT (DISPOSABLE) IMPLANT
SPONGE LAP 4X18 X RAY DECT (DISPOSABLE) ×4 IMPLANT
SPONGE SURGIFOAM ABS GEL 100 (HEMOSTASIS) ×8 IMPLANT
STAPLER VISISTAT 35W (STAPLE) IMPLANT
STRIP CLOSURE SKIN 1/2X4 (GAUZE/BANDAGES/DRESSINGS) ×2 IMPLANT
SURGIFLO W/THROMBIN 8M KIT (HEMOSTASIS) IMPLANT
SUT MNCRL AB 4-0 PS2 18 (SUTURE) ×4 IMPLANT
SUT PDS AB 1 CTX 36 (SUTURE) ×8 IMPLANT
SUT PROLENE 4 0 RB 1 (SUTURE)
SUT PROLENE 4-0 RB1 .5 CRCL 36 (SUTURE) IMPLANT
SUT PROLENE 5 0 C 1 24 (SUTURE) IMPLANT
SUT PROLENE 5 0 CC1 (SUTURE) IMPLANT
SUT PROLENE 6 0 C 1 30 (SUTURE) ×4 IMPLANT
SUT PROLENE 6 0 CC (SUTURE) IMPLANT
SUT SILK 0 TIES 10X30 (SUTURE) ×4 IMPLANT
SUT SILK 2 0 TIES 10X30 (SUTURE) ×8 IMPLANT
SUT SILK 2 0SH CR/8 30 (SUTURE) IMPLANT
SUT SILK 3 0 TIES 10X30 (SUTURE) ×8 IMPLANT
SUT SILK 3 0SH CR/8 30 (SUTURE) IMPLANT
SUT VIC AB 0 CT1 18XCR BRD 8 (SUTURE) ×2 IMPLANT
SUT VIC AB 0 CT1 8-18 (SUTURE) ×2
SUT VIC AB 1 CT1 27 (SUTURE) ×4
SUT VIC AB 1 CT1 27XBRD ANBCTR (SUTURE) ×4 IMPLANT
SUT VIC AB 1 CTX 36 (SUTURE) ×4
SUT VIC AB 1 CTX36XBRD ANBCTR (SUTURE) ×4 IMPLANT
SUT VIC AB 2-0 CT2 18 VCP726D (SUTURE) ×4 IMPLANT
SYR BULB IRRIGATION 50ML (SYRINGE) ×4 IMPLANT
SYR CONTROL 10ML LL (SYRINGE) ×6 IMPLANT
TAP VIPER MIS 4.35MM (TAP) ×4 IMPLANT
TAPE CLOTH SURG 4X10 WHT LF (GAUZE/BANDAGES/DRESSINGS) ×4 IMPLANT
TOWEL OR 17X24 6PK STRL BLUE (TOWEL DISPOSABLE) ×4 IMPLANT
TOWEL OR 17X26 10 PK STRL BLUE (TOWEL DISPOSABLE) ×4 IMPLANT
TRAY FOLEY CATH 16FR SILVER (SET/KITS/TRAYS/PACK) ×4 IMPLANT
TUBE CONNECTING 20'X1/4 (TUBING) ×1
TUBE CONNECTING 20X1/4 (TUBING) ×1 IMPLANT
WATER STERILE IRR 1000ML POUR (IV SOLUTION) ×4 IMPLANT
YANKAUER SUCT BULB TIP NO VENT (SUCTIONS) ×6 IMPLANT

## 2017-02-04 NOTE — Anesthesia Preprocedure Evaluation (Addendum)
Anesthesia Evaluation  Patient identified by MRN, date of birth, ID band Patient awake    Reviewed: Allergy & Precautions, NPO status , Patient's Chart, lab work & pertinent test results  Airway Mallampati: II  TM Distance: >3 FB Neck ROM: Full    Dental no notable dental hx.    Pulmonary neg pulmonary ROS, sleep apnea ,    Pulmonary exam normal breath sounds clear to auscultation       Cardiovascular hypertension, negative cardio ROS Normal cardiovascular exam Rhythm:Regular Rate:Normal     Neuro/Psych negative neurological ROS  negative psych ROS   GI/Hepatic negative GI ROS, Neg liver ROS,   Endo/Other  negative endocrine ROS  Renal/GU negative Renal ROS  negative genitourinary   Musculoskeletal negative musculoskeletal ROS (+)   Abdominal Normal abdominal exam  (+)   Peds negative pediatric ROS (+)  Hematology negative hematology ROS (+)   Anesthesia Other Findings   Reproductive/Obstetrics negative OB ROS                            Anesthesia Physical  Anesthesia Plan  ASA: II  Anesthesia Plan: General   Post-op Pain Management:    Induction: Intravenous  Airway Management Planned: Oral ETT  Additional Equipment: Arterial line  Intra-op Plan:   Post-operative Plan: Extubation in OR  Informed Consent: I have reviewed the patients History and Physical, chart, labs and discussed the procedure including the risks, benefits and alternatives for the proposed anesthesia with the patient or authorized representative who has indicated his/her understanding and acceptance.   Dental advisory given  Plan Discussed with: CRNA and Surgeon  Anesthesia Plan Comments:         Anesthesia Quick Evaluation

## 2017-02-04 NOTE — H&P (Signed)
Patient underwent stage 1 of his 2 stage procedure yesterday. Patient tolerated procedure well, and presents today for stage 2. Will proceed as planned. Specifically, will proceed with a posterior fusion with instrumentation spanning L2-S1.

## 2017-02-04 NOTE — Anesthesia Procedure Notes (Signed)
Procedure Name: Intubation Date/Time: 02/04/2017 8:43 AM Performed by: Jacquiline Doe A Pre-anesthesia Checklist: Patient identified, Emergency Drugs available, Suction available and Patient being monitored Patient Re-evaluated:Patient Re-evaluated prior to inductionOxygen Delivery Method: Circle System Utilized and Circle system utilized Preoxygenation: Pre-oxygenation with 100% oxygen Intubation Type: IV induction and Cricoid Pressure applied Ventilation: Mask ventilation without difficulty and Oral airway inserted - appropriate to patient size Laryngoscope Size: Mac and 4 Grade View: Grade I Tube type: Oral Tube size: 7.5 mm Number of attempts: 1 Airway Equipment and Method: Stylet Placement Confirmation: ETT inserted through vocal cords under direct vision,  positive ETCO2 and breath sounds checked- equal and bilateral Secured at: 23 cm Tube secured with: Tape Dental Injury: Teeth and Oropharynx as per pre-operative assessment

## 2017-02-04 NOTE — OR Nursing (Signed)
Radiology report at 1237 by Dr. Golden Circle; states no unintended foreign bodies present.

## 2017-02-04 NOTE — Progress Notes (Signed)
Set pt up on Cpap pt using his own mask brought from home 5.0 CM of H20 with 2 lpm bled in Oxygen.  Tolerating well

## 2017-02-04 NOTE — Anesthesia Preprocedure Evaluation (Signed)
Anesthesia Evaluation  Patient identified by MRN, date of birth, ID band Patient awake    Reviewed: Allergy & Precautions, NPO status , Patient's Chart, lab work & pertinent test results  Airway Mallampati: II  TM Distance: >3 FB Neck ROM: Full    Dental no notable dental hx.    Pulmonary neg pulmonary ROS, sleep apnea ,    Pulmonary exam normal breath sounds clear to auscultation       Cardiovascular hypertension, negative cardio ROS Normal cardiovascular exam Rhythm:Regular Rate:Normal     Neuro/Psych negative neurological ROS  negative psych ROS   GI/Hepatic negative GI ROS, Neg liver ROS,   Endo/Other  negative endocrine ROS  Renal/GU negative Renal ROS  negative genitourinary   Musculoskeletal negative musculoskeletal ROS (+)   Abdominal   Peds negative pediatric ROS (+)  Hematology negative hematology ROS (+)   Anesthesia Other Findings   Reproductive/Obstetrics negative OB ROS                             Anesthesia Physical Anesthesia Plan  ASA: II  Anesthesia Plan: General   Post-op Pain Management:    Induction: Intravenous  Airway Management Planned: Oral ETT  Additional Equipment: Arterial line  Intra-op Plan:   Post-operative Plan: Extubation in OR  Informed Consent: I have reviewed the patients History and Physical, chart, labs and discussed the procedure including the risks, benefits and alternatives for the proposed anesthesia with the patient or authorized representative who has indicated his/her understanding and acceptance.   Dental advisory given  Plan Discussed with: CRNA and Surgeon  Anesthesia Plan Comments:         Anesthesia Quick Evaluation

## 2017-02-04 NOTE — OR Nursing (Signed)
Volunteer desk to attempt notification of patient's family to update on progress of procedure at 70.

## 2017-02-04 NOTE — Transfer of Care (Signed)
Immediate Anesthesia Transfer of Care Note  Patient: Frank [name]  Procedure(s) Performed: Procedure(s) with comments: LUMBAR 5-SACRUM 1 ANTERIOR LUMBAR FUSION WITH INSTRUMENTATION AND ALLOGRAFT (N/A) - LUMBAR 4-5, LUMBAR 5-SACRUM 1 ANTERIOR LUMBAR FUSION WITH INSTRUMENTATION AND ALLOGRAFT RIGHT SIDED LUMBAR 3-4, LUMBAR 4-5 LATERAL INTERBODY FUSION WITH INSTRUMENTATION AND ALLOGRAFT (Right) - LEFT OR RIGHT SIDED LUMBAR 2-3, LUMBAR 3-4 LATERAL INTERBODY FUSION WITH INSTRUMENTATION AND ALLOGRAFT ABDOMINAL EXPOSURE (N/A)  Patient Location: PACU  Anesthesia Type:General  Level of Consciousness: lethargic and responds to stimulation, patient resting comfortably  Airway & Oxygen Therapy: Patient Spontanous Breathing and Patient connected to nasal cannula oxygen  Post-op Assessment: Report given to RN and Post -op Vital signs reviewed and stable  Post vital signs: Reviewed and stable  Last Vitals:  Vitals:   02/04/17 0639  BP: 131/80  Pulse: 65  Resp: 18  Temp: 36.7 C    Last Pain:  Vitals:   02/04/17 0658  TempSrc:   PainSc: 8       Patients Stated Pain Goal: 5 (74/71/85 5015)  Complications: No apparent anesthesia complications

## 2017-02-04 NOTE — Op Note (Signed)
    OPERATIVE REPORT  DATE OF SURGERY: 02/04/2017  PATIENT: Frank [name], 71 y.o. male MRN: [id number]  DOB: Oct 03, 1946  PRE-OPERATIVE DIAGNOSIS: Degenerative disc disease  POST-OPERATIVE DIAGNOSIS:  Same  PROCEDURE: Anterior exposure for L4-5 and L5-S1 disc surgery  SURGEON:  Curt Jews, M.D.  Co-surgeon for the exposure Dr. Burt Knack  ANESTHESIA:  Gen.  EBL: 200 ml  Total I/O In: 4450 [I.V.:4200; IV Piggyback:250] Out: 900 [Urine:700; Blood:200]  BLOOD ADMINISTERED: None  DRAINS: None  SPECIMEN: None  COUNTS CORRECT:  YES  PLAN OF CARE: PACU   PATIENT DISPOSITION:  PACU - hemodynamically stable  PROCEDURE DETAILS: The patient was taken to the operative placed supine position where the area of the abdomen in sterile fashion. Lateral C-arm projection was used to demonstrate the level of the L4-5 and L5-S1 disc. A left paramedian incision was made and carried through the subcutaneous fat with electrocautery. The anterior rectus sheath was opened with electrocautery lateral skin incision. The rectus muscle was mobilized circumferentially. The peroneal space was entered bluntly and the left lower quadrant and the intraperitoneal contents were removed to the right. The peritoneum was not entered. The left ureter was identified and was mobilized to the right. Blunt dissection over the L5-S1 disc was achieved with a KD. The middle sacral vessels were clipped and divided. Blunt dissection continued to give adequate exposure to the right and left of the L5-S1 disc. Next attention was turned to the L4-5 disc. Patient did have posterior plaque in his left common iliac artery but anterior surface and more distally was without calcification. The left iliac vein was identified and the iliolumbar vein was ligated and divided. Blunt dissection continued to mobilize the aorta and cava to the right. The Thompson retractor was brought onto the field and the reverse lip 150 blades were  positioned to the right and left of the L5-S1 disc. The fusion I was in the done by Dr.Dumonski to be dictated as a separate note. Following this I re-scrubbed and removed the retractors to the L4-5 level. Again the dissection will be as a separate note. The patient tolerated to me complication.   Rosetta Posner, M.D., [facility] 02/04/2017 2:04 PM

## 2017-02-05 ENCOUNTER — Inpatient Hospital Stay (HOSPITAL_COMMUNITY): Payer: Medicare Other

## 2017-02-05 ENCOUNTER — Inpatient Hospital Stay (HOSPITAL_COMMUNITY): Admission: RE | Admit: 2017-02-05 | Payer: Medicare Other | Source: Ambulatory Visit | Admitting: Orthopedic Surgery

## 2017-02-05 ENCOUNTER — Inpatient Hospital Stay (HOSPITAL_COMMUNITY): Payer: Medicare Other | Admitting: Anesthesiology

## 2017-02-05 ENCOUNTER — Encounter (HOSPITAL_COMMUNITY)
Admission: RE | Disposition: A | Discharge status: Home Health | Payer: Self-pay | Source: Ambulatory Visit | Attending: Orthopedic Surgery

## 2017-02-05 HISTORY — PX: POSTERIOR LUMBAR FUSION 4 LEVEL: SHX6037

## 2017-02-05 LAB — GLUCOSE, CAPILLARY: Glucose-Capillary: 116 mg/dL — ABNORMAL HIGH (ref 65–99)

## 2017-02-05 SURGERY — POSTERIOR LUMBAR FUSION 4 LEVEL
Anesthesia: General | Laterality: Bilateral

## 2017-02-05 MED ORDER — THROMBIN 20000 UNITS EX KIT
PACK | CUTANEOUS | Status: DC | PRN
  Administered 2017-02-05: 20000 [IU] via TOPICAL

## 2017-02-05 MED ORDER — MIDAZOLAM HCL 5 MG/5ML IJ SOLN
INTRAMUSCULAR | Status: DC | PRN
  Administered 2017-02-05: 2 mg via INTRAVENOUS

## 2017-02-05 MED ORDER — ONDANSETRON HCL 4 MG/2ML IJ SOLN
INTRAMUSCULAR | Status: DC | PRN
  Administered 2017-02-05 (×2): 4 mg via INTRAVENOUS

## 2017-02-05 MED ORDER — KETOROLAC TROMETHAMINE 30 MG/ML IJ SOLN
30.0000 mg | Freq: Once | INTRAMUSCULAR | Status: AC
  Administered 2017-02-05: 30 mg via INTRAVENOUS

## 2017-02-05 MED ORDER — HEMOSTATIC AGENTS (NO CHARGE) OPTIME
TOPICAL | Status: DC | PRN
  Administered 2017-02-05: 1 via TOPICAL

## 2017-02-05 MED ORDER — PHENYLEPHRINE HCL 10 MG/ML IJ SOLN
INTRAVENOUS | Status: DC | PRN
  Administered 2017-02-05: 45 ug/min via INTRAVENOUS

## 2017-02-05 MED ORDER — LACTATED RINGERS IV SOLN
INTRAVENOUS | Status: DC | PRN
  Administered 2017-02-05 (×3): via INTRAVENOUS

## 2017-02-05 MED ORDER — ROCURONIUM BROMIDE 100 MG/10ML IV SOLN
INTRAVENOUS | Status: DC | PRN
  Administered 2017-02-05: 40 mg via INTRAVENOUS

## 2017-02-05 MED ORDER — DEXTROSE 5 % IV SOLN
INTRAVENOUS | Status: DC | PRN
  Administered 2017-02-05: 08:00:00 via INTRAVENOUS

## 2017-02-05 MED ORDER — FENTANYL CITRATE (PF) 100 MCG/2ML IJ SOLN
INTRAMUSCULAR | Status: DC | PRN
  Administered 2017-02-05 (×3): 50 ug via INTRAVENOUS
  Administered 2017-02-05: 100 ug via INTRAVENOUS
  Administered 2017-02-05 (×3): 50 ug via INTRAVENOUS

## 2017-02-05 MED ORDER — LIDOCAINE HCL (CARDIAC) 20 MG/ML IV SOLN
INTRAVENOUS | Status: DC | PRN
  Administered 2017-02-05: 100 mg via INTRAVENOUS

## 2017-02-05 MED ORDER — KETOROLAC TROMETHAMINE 30 MG/ML IJ SOLN
INTRAMUSCULAR | Status: AC
  Administered 2017-02-05: 30 mg via INTRAVENOUS
  Filled 2017-02-05: qty 1

## 2017-02-05 MED ORDER — HYDROMORPHONE HCL 1 MG/ML IJ SOLN
INTRAMUSCULAR | Status: AC
  Filled 2017-02-05: qty 0.5

## 2017-02-05 MED ORDER — HYDROMORPHONE HCL 1 MG/ML IJ SOLN
0.2500 mg | INTRAMUSCULAR | Status: DC | PRN
  Administered 2017-02-05 (×4): 0.5 mg via INTRAVENOUS

## 2017-02-05 MED ORDER — SODIUM CHLORIDE 0.9% FLUSH
3.0000 mL | Freq: Two times a day (BID) | INTRAVENOUS | Status: DC
  Administered 2017-02-05: 3 mL via INTRAVENOUS

## 2017-02-05 MED ORDER — 0.9 % SODIUM CHLORIDE (POUR BTL) OPTIME
TOPICAL | Status: DC | PRN
  Administered 2017-02-05 (×2): 1000 mL

## 2017-02-05 MED ORDER — CEFAZOLIN SODIUM-DEXTROSE 2-4 GM/100ML-% IV SOLN
2.0000 g | Freq: Three times a day (TID) | INTRAVENOUS | Status: AC
  Administered 2017-02-06 (×2): 2 g via INTRAVENOUS
  Filled 2017-02-05 (×2): qty 100

## 2017-02-05 MED ORDER — PROPOFOL 10 MG/ML IV BOLUS
INTRAVENOUS | Status: DC | PRN
  Administered 2017-02-05: 200 mg via INTRAVENOUS

## 2017-02-05 MED ORDER — PHENYLEPHRINE HCL 10 MG/ML IJ SOLN
INTRAMUSCULAR | Status: DC | PRN
  Administered 2017-02-05: 80 ug via INTRAVENOUS

## 2017-02-05 MED ORDER — BUPIVACAINE-EPINEPHRINE 0.25% -1:200000 IJ SOLN
INTRAMUSCULAR | Status: DC | PRN
  Administered 2017-02-05: 8 mL
  Administered 2017-02-05: 20 mL

## 2017-02-05 MED ORDER — ALBUMIN HUMAN 5 % IV SOLN
INTRAVENOUS | Status: DC | PRN
  Administered 2017-02-05 (×2): via INTRAVENOUS

## 2017-02-05 MED ORDER — BUPIVACAINE LIPOSOME 1.3 % IJ SUSP
20.0000 mL | INTRAMUSCULAR | Status: DC
  Filled 2017-02-05: qty 20

## 2017-02-05 MED ORDER — SUGAMMADEX SODIUM 200 MG/2ML IV SOLN
INTRAVENOUS | Status: DC | PRN
  Administered 2017-02-05: 100 mg via INTRAVENOUS

## 2017-02-05 MED ORDER — MEPERIDINE HCL 25 MG/ML IJ SOLN: 6.2500 mg | INTRAMUSCULAR | Status: DC | PRN

## 2017-02-05 MED ORDER — BUPIVACAINE LIPOSOME 1.3 % IJ SUSP
INTRAMUSCULAR | Status: DC | PRN
  Administered 2017-02-05: 20 mL

## 2017-02-05 MED ORDER — PROMETHAZINE HCL 25 MG/ML IJ SOLN: 6.2500 mg | INTRAMUSCULAR | Status: DC | PRN

## 2017-02-05 MED ORDER — ARTIFICIAL TEARS OP OINT
TOPICAL_OINTMENT | OPHTHALMIC | Status: DC | PRN
  Administered 2017-02-05: 1 via OPHTHALMIC

## 2017-02-05 MED ORDER — SODIUM CHLORIDE 0.9 % IV SOLN: 250.0000 mL | INTRAVENOUS | Status: DC

## 2017-02-05 MED ORDER — HYDROMORPHONE HCL 1 MG/ML IJ SOLN
INTRAMUSCULAR | Status: AC
  Administered 2017-02-05: 0.5 mg via INTRAVENOUS
  Filled 2017-02-05: qty 0.5

## 2017-02-05 MED ORDER — SODIUM CHLORIDE 0.9% FLUSH: 3.0000 mL | INTRAVENOUS | Status: DC | PRN

## 2017-02-05 MED ORDER — POTASSIUM CHLORIDE IN NACL 20-0.9 MEQ/L-% IV SOLN
INTRAVENOUS | Status: DC
  Administered 2017-02-05: 14:00:00 via INTRAVENOUS
  Filled 2017-02-05: qty 1000

## 2017-02-05 MED ORDER — PROPOFOL 500 MG/50ML IV EMUL
INTRAVENOUS | Status: DC | PRN
  Administered 2017-02-05: 50 ug/kg/min via INTRAVENOUS

## 2017-02-05 MED FILL — Thrombin For Soln 20000 Unit: CUTANEOUS | Qty: 1 | Status: AC

## 2017-02-05 SURGICAL SUPPLY — 101 items
BENZOIN TINCTURE PRP APPL 2/3 (GAUZE/BANDAGES/DRESSINGS) ×1 IMPLANT
BLADE CLIPPER SURG (BLADE) ×1 IMPLANT
BLADE SURG 10 STRL SS (BLADE) ×1 IMPLANT
BONE VIVIGEN FORMABLE 5.4CC (Bone Implant) ×2 IMPLANT
BUR PRESCISION 1.7 ELITE (BURR) ×1 IMPLANT
BUR ROUND FLUTED 4 SOFT TCH (BURR) IMPLANT
BUR ROUND PRECISION 4.0 (BURR) ×1 IMPLANT
BUR SABER RD CUTTING 3.0 (BURR) ×1 IMPLANT
CARTRIDGE OIL MAESTRO DRILL (MISCELLANEOUS) ×2 IMPLANT
CLSR STERI-STRIP ANTIMIC 1/2X4 (GAUZE/BANDAGES/DRESSINGS) ×1 IMPLANT
CONT SPEC STER OR (MISCELLANEOUS) ×1 IMPLANT
CORDS BIPOLAR (ELECTRODE) ×2 IMPLANT
COVER SURGICAL LIGHT HANDLE (MISCELLANEOUS) ×2 IMPLANT
DIFFUSER DRILL AIR PNEUMATIC (MISCELLANEOUS) ×2 IMPLANT
DRAIN CHANNEL 15F RND FF W/TCR (WOUND CARE) ×2 IMPLANT
DRAPE C-ARM 42X72 X-RAY (DRAPES) ×3 IMPLANT
DRAPE INCISE IOBAN 66X45 STRL (DRAPES) ×1 IMPLANT
DRAPE ORTHO SPLIT 77X108 STRL (DRAPES) ×1
DRAPE POUCH INSTRU U-SHP 10X18 (DRAPES) ×2 IMPLANT
DRAPE SURG 17X23 STRL (DRAPES) ×8 IMPLANT
DRAPE SURG ORHT 6 SPLT 77X108 (DRAPES) ×1 IMPLANT
DRSG MEPILEX BORDER 4X12 (GAUZE/BANDAGES/DRESSINGS) IMPLANT
DRSG MEPILEX BORDER 4X8 (GAUZE/BANDAGES/DRESSINGS) IMPLANT
DURAPREP 26ML APPLICATOR (WOUND CARE) ×2 IMPLANT
ELECT BLADE 4.0 EZ CLEAN MEGAD (MISCELLANEOUS) ×2
ELECT CAUTERY BLADE 6.4 (BLADE) ×4 IMPLANT
ELECT REM PT RETURN 9FT ADLT (ELECTROSURGICAL) ×2
ELECTRODE BLDE 4.0 EZ CLN MEGD (MISCELLANEOUS) ×1 IMPLANT
ELECTRODE REM PT RTRN 9FT ADLT (ELECTROSURGICAL) ×1 IMPLANT
EVACUATOR SILICONE 100CC (DRAIN) ×2 IMPLANT
FEE INTRAOP MONITOR IMPULS NCS (MISCELLANEOUS) IMPLANT
FILTER STRAW FLUID ASPIR (MISCELLANEOUS) ×1 IMPLANT
GAUZE SPONGE 4X4 12PLY STRL (GAUZE/BANDAGES/DRESSINGS) ×2 IMPLANT
GAUZE SPONGE 4X4 16PLY XRAY LF (GAUZE/BANDAGES/DRESSINGS) ×6 IMPLANT
GLOVE BIO SURGEON STRL SZ7 (GLOVE) ×2 IMPLANT
GLOVE BIO SURGEON STRL SZ8 (GLOVE) ×2 IMPLANT
GLOVE BIOGEL PI IND STRL 8 (GLOVE) ×1 IMPLANT
GLOVE BIOGEL PI INDICATOR 8 (GLOVE) ×1
GOWN STRL REUS W/ TWL LRG LVL3 (GOWN DISPOSABLE) ×2 IMPLANT
GOWN STRL REUS W/ TWL XL LVL3 (GOWN DISPOSABLE) ×1 IMPLANT
GOWN STRL REUS W/TWL LRG LVL3 (GOWN DISPOSABLE) ×2
GOWN STRL REUS W/TWL XL LVL3 (GOWN DISPOSABLE) ×1
GRAFT BNE MATRIX VG FRMBL MD 5 (Bone Implant) IMPLANT
GUIDEWIRE BLUNT VIPER II 1.45 (WIRE) ×2 IMPLANT
GUIDEWIRE SHARP VIPER II (WIRE) ×14 IMPLANT
INTRAOP MONITOR FEE IMPULS NCS (MISCELLANEOUS) ×1
INTRAOP MONITOR FEE IMPULSE (MISCELLANEOUS) ×1
IV CATH 14GX2 1/4 (CATHETERS) ×2 IMPLANT
KIT BASIN OR (CUSTOM PROCEDURE TRAY) ×2 IMPLANT
KIT POSITION SURG JACKSON T1 (MISCELLANEOUS) ×2 IMPLANT
KIT ROOM TURNOVER OR (KITS) ×2 IMPLANT
MARKER SKIN DUAL TIP RULER LAB (MISCELLANEOUS) ×2 IMPLANT
NDL HYPO 25GX1X1/2 BEV (NEEDLE) ×1 IMPLANT
NDL JAMSHIDI VIPER (NEEDLE) IMPLANT
NDL SPNL 18GX3.5 QUINCKE PK (NEEDLE) ×2 IMPLANT
NEEDLE 22X1 1/2 (OR ONLY) (NEEDLE) ×1 IMPLANT
NEEDLE HYPO 25GX1X1/2 BEV (NEEDLE) ×2 IMPLANT
NEEDLE JAMSHIDI VIPER (NEEDLE) ×8 IMPLANT
NEEDLE SPNL 18GX3.5 QUINCKE PK (NEEDLE) ×4 IMPLANT
NS IRRIG 1000ML POUR BTL (IV SOLUTION) ×3 IMPLANT
OIL CARTRIDGE MAESTRO DRILL (MISCELLANEOUS)
PACK LAMINECTOMY ORTHO (CUSTOM PROCEDURE TRAY) ×2 IMPLANT
PACK UNIVERSAL I (CUSTOM PROCEDURE TRAY) ×2 IMPLANT
PAD ARMBOARD 7.5X6 YLW CONV (MISCELLANEOUS) ×4 IMPLANT
PATTIES SURGICAL .5 X1 (DISPOSABLE) ×2 IMPLANT
PATTIES SURGICAL .5 X3 (DISPOSABLE) ×1 IMPLANT
PATTIES SURGICAL .5X1.5 (GAUZE/BANDAGES/DRESSINGS) ×2 IMPLANT
PATTIES SURGICAL .75X.75 (GAUZE/BANDAGES/DRESSINGS) ×2 IMPLANT
ROD VIPER LORD 5.5X110MM (Rod) ×2 IMPLANT
SCREW POLY VIPER2 7X40MM (Screw) ×5 IMPLANT
SCREW POLY XTAB 5X40MM (Screw) ×1 IMPLANT
SCREW SET SINGLE INNER MIS (Screw) ×10 IMPLANT
SCREW VIPER II XTAB POLY 7X45M (Screw) ×1 IMPLANT
SCREW VIPER X-TAB 6.0X40 (Screw) ×4 IMPLANT
SLEEVE SURGEON STRL (DRAPES) ×1 IMPLANT
SPONGE GAUZE 4X4 12PLY STER LF (GAUZE/BANDAGES/DRESSINGS) ×1 IMPLANT
SPONGE INTESTINAL PEANUT (DISPOSABLE) ×2 IMPLANT
SPONGE SURGIFOAM ABS GEL 100 (HEMOSTASIS) ×1 IMPLANT
STRIP CLOSURE SKIN 1/2X4 (GAUZE/BANDAGES/DRESSINGS) IMPLANT
SURGIFLO W/THROMBIN 8M KIT (HEMOSTASIS) IMPLANT
SUT MNCRL AB 4-0 PS2 18 (SUTURE) ×4 IMPLANT
SUT PROLENE 6 0 C 1 24 (SUTURE) IMPLANT
SUT VIC AB 0 CT1 18XCR BRD 8 (SUTURE) ×2 IMPLANT
SUT VIC AB 0 CT1 8-18 (SUTURE) ×2
SUT VIC AB 1 CT1 18XCR BRD 8 (SUTURE) ×2 IMPLANT
SUT VIC AB 1 CT1 8-18 (SUTURE) ×2
SUT VIC AB 2-0 CT2 18 VCP726D (SUTURE) ×5 IMPLANT
SYR 20CC LL (SYRINGE) ×3 IMPLANT
SYR BULB IRRIGATION 50ML (SYRINGE) ×2 IMPLANT
SYR CONTROL 10ML LL (SYRINGE) ×2 IMPLANT
SYR TB 1ML LUER SLIP (SYRINGE) ×2 IMPLANT
SYRINGE 1CC SLIP TB (MISCELLANEOUS) ×1 IMPLANT
TAP CANN VIPER2 DL 5.0 (TAP) ×2 IMPLANT
TAP CANN VIPER2 DL 6.0 (TAP) ×2 IMPLANT
TAP VIPER MIS 4.35MM (TAP) ×2 IMPLANT
TAPE CLOTH SURG 4X10 WHT LF (GAUZE/BANDAGES/DRESSINGS) ×1 IMPLANT
TOWEL OR 17X24 6PK STRL BLUE (TOWEL DISPOSABLE) ×2 IMPLANT
TOWEL OR 17X26 10 PK STRL BLUE (TOWEL DISPOSABLE) ×2 IMPLANT
TRAY FOLEY W/METER SILVER 16FR (SET/KITS/TRAYS/PACK) ×1 IMPLANT
WATER STERILE IRR 1000ML POUR (IV SOLUTION) ×1 IMPLANT
YANKAUER SUCT BULB TIP NO VENT (SUCTIONS) ×2 IMPLANT

## 2017-02-05 NOTE — Op Note (Signed)
NAME:  Frank [name], Frank [name]                   ACCOUNT NO.:  MEDICAL RECORD NO.:  [id number]  LOCATION:                                 FACILITY:  PHYSICIAN:  Phylliss Bob, MD           DATE OF BIRTH:  DATE OF PROCEDURE:  02/04/2017                              OPERATIVE REPORT   PREOPERATIVE DIAGNOSES: 1. Lumbar degenerative scoliosis. 2. Varying degrees of spinal stenosis spanning L2-S1. 3. Severe bilateral leg pain.  POSTOPERATIVE DIAGNOSES: 1. Lumbar degenerative scoliosis. 2. Varying degrees of spinal stenosis spanning L2-S1. 3. Severe bilateral leg pain.  PROCEDURE (STAGE 1 OF 2): 1. Anterior lumbar interbody fusion via an anterior retroperitoneal     approach, L4-5, L5-S1. 2. Anterior lumbar fusion via a direct right-sided lateral approach at     L2-3, L3-4. 3. Placement of anterior instrumentation, L4-5, L5-S1. 4. Insertion of interbody device x4 (NuVasive intervertebral spacers,     L2-3, L3-4, Cougar) intervertebral spacers, L4-5, L5-S1. 5. Use of morselized allograft-ViviGen. 6. Intraoperative use of fluoroscopy.  SURGEON:  Phylliss Bob, MD  ASSISTANT:  Pricilla Holm, PA-C.  Of note, I did function as a co-surgeon for the anterior retroperitoneal approach with Dr. Sherren Mocha Early for the L4-5 and L5-S1 levels.  I did perform my own approach for the L2-3 and L3-4 levels.  ANESTHESIA:  General endotracheal anesthesia.  COMPLICATIONS:  None.  DISPOSITION:  Stable.  ESTIMATED BLOOD LOSS:  Minimal.  INDICATIONS FOR SURGERY:  Briefly, Frank [name] is a very pleasant 71- year-old male who did present to me with severe and ongoing pain in the back and bilateral legs.  The patient's imaging did reveal the findings outlined above.  The patient did fail multiple forms of nonoperative measures, and did feel rather debilitated as a result of his pain. Given his ongoing pain and dysfunction, we did discuss proceeding with the procedure reflected above.  The patient was fully  aware of the risks and limitations and did elect to proceed.  OPERATIVE DETAILS:  On February 04, 2017, the patient was brought to surgery and general endotracheal anesthesia was administered.  The patient was placed supine on the hospital bed.  All bony prominences were padded.  The abdomen was prepped and draped in the usual sterile fashion.  An anterior retroperitoneal approach was then performed by Dr. Curt Jews.  I did function as his co-surgeon for the approach.  He did adequately visualize the anterior aspect of the L5-S1 intervertebral space.  Once this was visualized, I did proceed with a thorough and complete L5-S1 intervertebral diskectomy.  I then placed a series of trials and I did ultimately select a 12 mm medium intervertebral spacer with 10 degrees of lordosis, which was packed with the ViviGen and tamped into position.  Excellent restoration of the intervertebral space height was noted.  Dr. Sherren Mocha Early then scrubbed back into the procedure and did expose the anterior aspect of the L4-5 intervertebral space.  Of note, the exposure was somewhat suboptimal, given calcifications identified of the iliac vessels.  The entire left side of the intervertebral space was noted and only a partial portion of the right intervertebral  space.  However, I was able to perform a thorough and complete L4-5 intervertebral diskectomy, more towards the left.  A series of intervertebral spacer trial was then placed and the same size intervertebral implant was packed with ViviGen and tamped into position. I was very pleased with the press-fit of the implant and the appearance on AP and lateral fluoroscopy.  It was slightly toward the left, given the exposure, but I was pleased with the press-fit and the restoration of height at each level.  I then proceeded with the anterior instrumentation portion of the procedure.  I did cannulate the superior and anterior aspect of the L5 and S1 vertebral  bodies.  At each level, a screw with an anterior fixation device was placed, securing the L4-5 and L5-S1 intervertebral spaces.  I was pleased with the press-fit of the screw.  The wound was then copiously irrigated.  The fascia was then closed using #1 PDS and the subcutaneous layer was closed using 0 Vicryl, followed by 0 Vicryl, followed by 4-0 Monocryl.  Benzoin and Steri-Strips were then applied followed by sterile dressing.  At this point, the patient was then rolled into the lateral decubitus position with the right side up.  The patient's hips and knees were flexed and the patient's legs and upper torso were secured to the bed.  The bed was slightly angled in order to optimize visualization of the L2-3 and L3-4 intervertebral spaces.  Neurologic monitoring was used.  The right flank was then prepped and draped again, a time-out procedure was performed. I then made a transverse incision on the right side, halfway between the L2-3 and L3-4 intervertebral spaces.  The external and internal and transversalis musculature was dissected and the retroperitoneal space was encountered.  The L2-3 intervertebral space was then noted.  I was between the 11th and 12th ribs.  I then sequentially dilated over the L2- 3 intervertebral space using neurologic monitoring.  It was noted that there were no neurologic structures in the immediate vicinity of the dilators.  A self-retaining retractor was then placed over the dilators and secured to the bed.  I was very pleased with the resting position of the dilator.  I then performed an annulotomy at the lateral aspect of the L2-3 intervertebral space.  A Cobb was used to release the contralateral anulus.  I then proceeded with a thorough and complete L2- 3 intervertebral diskectomy.  The endplates were appropriately prepared. A series of trial spacers were placed and I did ultimately select an 18 x 12 mm intervertebral spacer, lordotic.  The  intervertebral spacer was then packed with ViviGen and tamped into position in the usual fashion. I was very pleased with the press-fit of the implant.  The dilator was then removed and the wound was explored for any undue bleeding and there was no bleeding encountered.  I then dissected through the external and internal oblique musculature and the transversalis fascia.  The peritoneum was bluntly swept anteriorly and then I again sequentially dilated over the L3-4 intervertebral space.  A self-retaining retractor was then placed.  There were no neurologic structures in the vicinity of the exposure.  I then proceeded with a thorough and complete L3-4 intervertebral diskectomy.  The same size intervertebral spacer was then packed with the ViviGen and tamped into position in the usual fashion. I was very pleased with the press-fit of the implant.  The wound was then copiously irrigated.  The fascia was then closed using 0 Vicryl and the  subcutaneous layer was closed using 2-0 Vicryl followed by 4-0 Monocryl.  Benzoin and Steri-Strips were then applied followed by sterile dressing.  All instrument counts were correct at the termination of the procedure.  Of note, Pricilla Holm did function as my assistant throughout the entire surgery, and did aid in retraction, suctioning, and closure.  Of note, there was no sustained abnormal EMG activity noted throughout the surgery.  The plan is to take the patient back to surgery tomorrow for stage 2 of  his 2-stage procedure. Specifically, a posterior spinal fusion with  instrumentation spanning L2-S1     Phylliss Bob, MD     MD/MEDQ  D:  02/04/2017  T:  02/04/2017  Job:  [id number]

## 2017-02-05 NOTE — Progress Notes (Signed)
Pt admitted from PACU post op, alert and oriented accompanied by family, settled in bed with call light within pt's reach, safety measures maintained and explained to pt and family, v/s stable, will however continue to monitor. Obasogie-Asidi, Anabeth Chilcott Efe

## 2017-02-05 NOTE — Anesthesia Postprocedure Evaluation (Signed)
Anesthesia Post Note  Patient: Frank [name]  Procedure(s) Performed: Procedure(s) (LRB): LUMBAR 5-SACRUM 1 ANTERIOR LUMBAR FUSION WITH INSTRUMENTATION AND ALLOGRAFT (N/A) RIGHT SIDED LUMBAR 3-4, LUMBAR 4-5 LATERAL INTERBODY FUSION WITH INSTRUMENTATION AND ALLOGRAFT (Right) ABDOMINAL EXPOSURE (N/A)  Patient location during evaluation: PACU Anesthesia Type: General Level of consciousness: awake and alert Pain management: pain level controlled Vital Signs Assessment: post-procedure vital signs reviewed and stable Respiratory status: spontaneous breathing, nonlabored ventilation, respiratory function stable and patient connected to nasal cannula oxygen Cardiovascular status: blood pressure returned to baseline and stable Postop Assessment: no signs of nausea or vomiting Anesthetic complications: no       Last Vitals:  Vitals:   02/05/17 0121 02/05/17 0504  BP: 125/65 (!) 127/57  Pulse: 81 78  Resp: 20 20  Temp: 37.4 C 37.4 C    Last Pain:  Vitals:   02/05/17 0633  TempSrc:   PainSc: 4                  Dmauri Rosenow S

## 2017-02-05 NOTE — Transfer of Care (Signed)
Immediate Anesthesia Transfer of Care Note  Patient: Frank [name]  Procedure(s) Performed: Procedure(s) with comments: LUMBAR 2-3, LUMBAR 3-4, LUMBAR 4-5, LUMBAR 5-SACRUM 1 POSTERIOR SPINAL FUSION WITH INSTRUMENTATION AND ALLOGRAFT (Bilateral) - LUMBAR 2-3, LUMBAR 3-4, LUMBAR 4-5, LUMBAR 5-SACRUM 1 POSTERIOR SPINAL FUSION WITH INSTRUMENTATION AND ALLOGRAFT  Patient Location: PACU  Anesthesia Type:General  Level of Consciousness: awake, sedated, patient cooperative and responds to stimulation  Airway & Oxygen Therapy: Patient Spontanous Breathing and Patient connected to nasal cannula oxygen  Post-op Assessment: Report given to RN, Post -op Vital signs reviewed and stable, Patient moving all extremities and Patient moving all extremities X 4  Post vital signs: Reviewed and stable  Last Vitals:  Vitals:   02/05/17 0504 02/05/17 1217  BP: (!) 127/57   Pulse: 78 78  Resp: 20 17  Temp: 37.4 C     Last Pain:  Vitals:   02/05/17 0633  TempSrc:   PainSc: 4       Patients Stated Pain Goal: 3 (68/12/75 1700)  Complications: No apparent anesthesia complications

## 2017-02-05 NOTE — Progress Notes (Signed)
Patient left the floor for surgery at [phone] accompanied by wife.  Comfortable after IV morphine given at 0600.

## 2017-02-05 NOTE — Anesthesia Postprocedure Evaluation (Addendum)
Anesthesia Post Note  Patient: Frank [name]  Procedure(s) Performed: Procedure(s) (LRB): LUMBAR 2-3, LUMBAR 3-4, LUMBAR 4-5, LUMBAR 5-SACRUM 1 POSTERIOR SPINAL FUSION WITH INSTRUMENTATION AND ALLOGRAFT (Bilateral)  Patient location during evaluation: PACU Anesthesia Type: General Level of consciousness: awake and sedated Pain management: pain level controlled Vital Signs Assessment: post-procedure vital signs reviewed and stable Respiratory status: spontaneous breathing Cardiovascular status: stable Postop Assessment: no signs of nausea or vomiting Anesthetic complications: no        Last Vitals:  Vitals:   02/05/17 1217 02/05/17 1232  BP: 124/86 127/76  Pulse: 78 74  Resp: 17 20  Temp: 36.3 C     Last Pain:  Vitals:   02/05/17 1217  TempSrc:   PainSc: Asleep   Pain Goal: Patients Stated Pain Goal: 3 (02/05/17 0600)               Lysha Schrade JR,JOHN Mateo Flow

## 2017-02-05 NOTE — Anesthesia Procedure Notes (Signed)
Procedure Name: Intubation Date/Time: 02/05/2017 7:37 AM Performed by: Jacquiline Doe A Pre-anesthesia Checklist: Patient identified, Emergency Drugs available, Suction available and Patient being monitored Patient Re-evaluated:Patient Re-evaluated prior to inductionOxygen Delivery Method: Circle System Utilized and Circle system utilized Preoxygenation: Pre-oxygenation with 100% oxygen Intubation Type: IV induction and Cricoid Pressure applied Ventilation: Mask ventilation without difficulty and Oral airway inserted - appropriate to patient size Laryngoscope Size: Mac and 4 Grade View: Grade I Tube type: Oral Tube size: 7.5 mm Number of attempts: 1 Airway Equipment and Method: Stylet and Oral airway Placement Confirmation: ETT inserted through vocal cords under direct vision,  positive ETCO2 and breath sounds checked- equal and bilateral Secured at: 23 cm Tube secured with: Tape Dental Injury: Teeth and Oropharynx as per pre-operative assessment

## 2017-02-05 NOTE — Consult Note (Signed)
[facility] CM Primary Care Navigator  02/05/2017  Frank [name] 11/16/1946 [id number]   Met with patient, wife (Frank [name]) and daughters (Frank [name] and Frank [name]) at the bedside to identify possible discharge needs. Patient and wife reports that he was having pain to right leg radiating to left due to scoliosis which had led to this admission/surgery.  Patient endorses Dr. John Hall with [facility] Hall MD office as the primary care provider.   Patient shared using [facility] in [place] to obtain medications without any problem.   Patient reports managing his own medications at home straight out of the containers (not taking much).   He was very active, independent with care and able to drive prior to admission/ surgery.  His daughters or wife will be providing transportation to his doctors' appointments when discharged.  Wife and daughter (Frank [name]) will be his primary caregivers at home as stated.  Anticipated discharge plan is home with home health services per wife.   Patient and family voiced understanding to call primary care provider's office when he returns home, for a post discharge follow-up appointment within a week or sooner if needs arise. Patient letter (with PCP's contact number) was provided as a reminder.  Patient and wife denies any health management needs or concerns at this time.  For additional questions please contact:   A. , BSN, RN-BC [facility] PRIMARY CARE Navigator Cell: [phone] 

## 2017-02-05 NOTE — Progress Notes (Signed)
Patient placed on CPAP for the night without complication. RT will continue to monitor as needed. 

## 2017-02-06 MED ORDER — PANTOPRAZOLE SODIUM 40 MG PO TBEC
40.0000 mg | DELAYED_RELEASE_TABLET | Freq: Every day | ORAL | Status: DC
  Administered 2017-02-06: 40 mg via ORAL
  Filled 2017-02-06: qty 1

## 2017-02-06 MED FILL — Thrombin For Soln 20000 Unit: CUTANEOUS | Qty: 1 | Status: AC

## 2017-02-06 NOTE — Progress Notes (Signed)
Patient placed on CPAP without complication.  

## 2017-02-06 NOTE — Op Note (Signed)
NAME:  ULISSES, VONDRAK                   ACCOUNT NO.:  MEDICAL RECORD NO.:  [id number]  LOCATION:                                 FACILITY:  PHYSICIAN:  Phylliss Bob, MD           DATE OF BIRTH:  DATE OF PROCEDURE:  02/05/2017                               OPERATIVE REPORT   PREOPERATIVE DIAGNOSES: 1. Lumbar degenerative scoliosis. 2. Spinal stenosis. 3. Status post previous anterior/lateral interbody fusion L2-3, L3-4,     L4-5, L5-S1, requiring posterior instrumentation and fusion.  POSTOPERATIVE DIAGNOSES: 1. Lumbar degenerative scoliosis. 2. Spinal stenosis. 3. Status post previous anterior/lateral interbody fusion L2-3, L3-4,     L4-5, L5-S1, requiring posterior instrumentation and fusion.  PROCEDURE (STAGE 2 OF 2): 1. Posterior spinal fusion, L2-3, L3-4, L4-5, L5-S1. 2. Placement of posterior segmental instrumentation, L2, L3, L4, L5,     S1, bilateral. 3. Intraoperative use of fluoroscopy. 4. Use of morselized allograft-ViviGen.  SURGEON:  Phylliss Bob, MD.  ASSISTANTPricilla Holm, PA-C.  ANESTHESIA:  General endotracheal anesthesia.  COMPLICATIONS:  None.  DISPOSITION:  Stable.  ESTIMATED BLOOD LOSS:  Minimal.  INDICATIONS FOR SURGERY:  Briefly, Mr. Bledsoe is a pleasant 71 year old male, who is status post the procedure reflected above, which did occur on February 04, 2017.  The patient did very well from that surgery, and did present today for stage II of what was to be a 2-stage procedure as noted above.  I would refer to my operative report dated February 04, 2017, for a full description of the patient's indications for surgery.  OPERATIVE DETAILS:  On February 05, 2017, the patient was brought to surgery and general endotracheal anesthesia was administered.  The patient was placed prone on a well-padded flat Jackson bed with a spinal frame.  Antibiotics were given and a time-out procedure was performed. The back was prepped and draped in the usual  sterile fashion.  At this point, I did bring in fluoroscopy, and I did demarcate the pedicles from L2-S1.  Paramedian incisions were then made just lateral to the pedicles from L2-S1.  This was done on both the right and left sides.  I then cannulated the L2, L3, L4, L5, and S1 pedicles using Jamshidi's and intraoperative AP and lateral fluoroscopy.  Guidewires were then placed through the cannulated pedicles.  On the right side, the posterolateral gutter and facet joints at L2-3, L3-4, L4-5, and L5-S1 were exposed and decorticated using a 4 mm high-speed bur.  ViviGen was then packed into the posterolateral gutter and posterior elements, in order to aid in the success of the fusion.  I then placed 7 x 40 mm screws bilaterally at L4, L5, and S1.  A 6 x 40 mm screws were placed in L2 and L3 bilaterally.  Of note, on the right side at L5, I did place a 7 x 35 mm screw.  Of note, I did recheck an EMG, well advancing each of the taps, and there was no tap that tested below 20 milliamps.  Once the screws were placed, a 110 mm rod was secured into the tulip heads of the screws bilaterally.  Caps were placed and a final locking procedure was performed.  I was very pleased with the final AP and lateral fluoroscopic images.  I was very pleased with the correction of the patient's coronal and sagittal alignment, noted on both AP and lateral images.  The wound was then copiously irrigated.  The fascia was then closed using #1 Vicryl.  The subcutaneous layer was closed using 2-0 Vicryl and the skin was closed using 4-0 Monocryl.  Benzoin and Steri- Strips were applied, followed by sterile dressing.  All instrument counts were correct at the termination of the procedure. Of note, Pricilla Holm was my assistant throughout surgery, and did aid in retraction suctioning and closure from start to finish. Of note, there was no abnormal EMG activity noted throughout the entire surgery.     Phylliss Bob, MD     MD/MEDQ  D:  02/05/2017  T:  02/05/2017  Job:  [id number]

## 2017-02-06 NOTE — Evaluation (Signed)
Physical Therapy Evaluation Patient Details Name: Frank [name] MRN: [id number] DOB: 1946-10-26 Today's Date: 02/06/2017   History of Present Illness  Pt is a 71 y/o male s/p Posterior spinal fusion, L2-3, L3-4, L4-5, L5-S1. Pt has a past medical history including Arthritis; Cancer; Hypertension.   Clinical Impression  Patient presents with decreased independence with mobility due to deficits listed in PT problem list.  He was functioning independent walking 2 miles daily prior to admission, currently min A for all mobility.  Will benefit from skilled PT in the acute setting to allow return home with wife assist and follow up HHPT.     Follow Up Recommendations Home health PT    Equipment Recommendations  None recommended by PT    Recommendations for Other Services       Precautions / Restrictions Precautions Precautions: Back Precaution Booklet Issued: Yes (comment) Precaution Comments: handout reviewed in full with Pt and wife Required Braces or Orthoses: Spinal Brace Spinal Brace: Thoracolumbosacral orthotic;Applied in sitting position Restrictions Weight Bearing Restrictions: No      Mobility  Bed Mobility               General bed mobility comments: Pt OOB in chair when therapy arrived in the room, educated Pt and wife on rolling and side laying to sit technique with precautions - handout provided as well  Transfers Overall transfer level: Needs assistance Equipment used: Rolling walker (2 wheeled) Transfers: Sit to/from Stand Sit to Stand: Min assist         General transfer comment: good hand placement, increased time required  Ambulation/Gait   Ambulation Distance (Feet): 80 Feet Assistive device: Rolling walker (2 wheeled) Gait Pattern/deviations: Step-through pattern;Decreased stride length;Shuffle;Trunk flexed     General Gait Details: cues for posture, cues for step height, assist for balance, safety, cues for monitoring level of arousal as up  earlier w/NT with pre-syncope symptoms  Stairs            Wheelchair Mobility    Modified Rankin (Stroke Patients Only)       Balance Overall balance assessment: Needs assistance Sitting-balance support: Single extremity supported;Feet supported Sitting balance-Leahy Scale: Fair Sitting balance - Comments: sitting with no back support in recliner for don/doff brace   Standing balance support: Bilateral upper extremity supported;During functional activity Standing balance-Leahy Scale: Poor Standing balance comment: reliant on RW                              Pertinent Vitals/Pain Pain Assessment: 0-10 Pain Score: 7  Pain Location: abdomen, side and belly incisions Pain Descriptors / Indicators: Sore Pain Intervention(s): Monitored during session;Repositioned    Home Living Family/patient expects to be discharged to:: Private residence Living Arrangements: Spouse/significant other Available Help at Discharge: Family Type of Home: House Home Access: Stairs to enter   Technical brewer of Steps: 1 Home Layout: Able to live on main level with bedroom/bathroom;Two level;Full bath on main level;Laundry or work area in [place]: Environmental consultant - 2 wheels;Hand held shower head;Shower seat - built in (working on Dietitian)      Prior Function Level of Independence: Independent         Comments: walks 2 miles a day     Hand Dominance   Dominant Hand: Right    Extremity/Trunk Assessment   Upper Extremity Assessment Upper Extremity Assessment: Defer to OT evaluation    Lower Extremity Assessment Lower Extremity Assessment: Generalized  weakness (not formally tested, but decreased foot clearance with ambulation R>L and need )    Cervical / Trunk Assessment Cervical / Trunk Assessment: Other exceptions Cervical / Trunk Exceptions: s/p surgeries  Communication   Communication: No difficulties  Cognition Arousal/Alertness: Suspect  due to medications Behavior During Therapy: WFL for tasks assessed/performed;Flat affect Overall Cognitive Status: Within Functional Limits for tasks assessed                                 General Comments: Pt slightly lethargic during session, wife providing most of background information      General Comments General comments (skin integrity, edema, etc.): wife present for session, all education    Exercises     Assessment/Plan    PT Assessment Patient needs continued PT services  PT Problem List Decreased strength;Decreased mobility;Decreased knowledge of use of DME;Decreased knowledge of precautions;Decreased activity tolerance;Decreased balance;Pain       PT Treatment Interventions DME instruction;Gait training;Therapeutic exercise;Patient/family education;Balance training;Functional mobility training;Therapeutic activities    PT Goals (Current goals can be found in the Care Plan section)  Acute Rehab PT Goals Patient Stated Goal: to have no pain, and get back to walking PT Goal Formulation: With patient/family Time For Goal Achievement: 02/13/17 Potential to Achieve Goals: Good    Frequency Min 5X/week   Barriers to discharge        Co-evaluation PT/OT/SLP Co-Evaluation/Treatment: Yes Reason for Co-Treatment: For patient/therapist safety PT goals addressed during session: Mobility/safety with mobility;Proper use of DME OT goals addressed during session: ADL's and self-care       End of Session Equipment Utilized During Treatment: Back brace;Gait belt Activity Tolerance: Patient limited by fatigue Patient left: in chair;with call bell/phone within reach;with family/visitor present   PT Visit Diagnosis: Difficulty in walking, not elsewhere classified (R26.2);Muscle weakness (generalized) (M62.81)    Time: 6387-5643 PT Time Calculation (min) (ACUTE ONLY): 24 min   Charges:   PT Evaluation $PT Eval Moderate Complexity: 1 Procedure     PT G  CodesMagda Kiel, PT [phone] 02/06/2017   Reginia Naas 02/06/2017, 11:34 AM

## 2017-02-06 NOTE — Progress Notes (Addendum)
    Patient seen 7:30AM, doing well PO day 2 S/P ANT L4-5, L5-S1, LAT L2-3, L3-4 interbody fusions and PO Day 1 S/P L2-S1 Posterior hardware placement.   Resolved pre-op leg pain. Expected PO LBP, currently well controlled however pt resting in bed and has not yet been up/walking. He ahs been eating, NL bladder function, no BM yet, yes passing gas. Wife at bedside. They are thrilled by his results thus far. Eager to progress with PT/OT  Physical Exam: BP (!) 123/57 (BP Location: Left Arm)   Pulse 83   Temp 98.1 F (36.7 C) (Oral)   Resp 20   Ht 6\' 2"  (1.88 m)   Wt 91.1 kg (200 lb 13.4 oz)   SpO2 97%   BMI 25.79 kg/m   Dressing in place, CDI, pt resting comfortably in bed, SCD's in place.  NVI  POD day 2 S/P ANT L4-5, L5-S1, LAT L2-3, L3-4 interbody fusions and PO Day 1 S/P L2-S1 Posterior hardware placement.  - up with PT/OT, encourage ambulation  -Will require HH upon D/C - Percocet for pain, Valium for muscle spasms - likely d/c home today vs tomorrow pending PT/OT

## 2017-02-06 NOTE — Evaluation (Signed)
Occupational Therapy Evaluation Patient Details Name: Frank [name] MRN: [id number] DOB: 07-Nov-1946 Today's Date: 02/06/2017    History of Present Illness Pt is a 71 y/o male s/p Posterior spinal fusion, L2-3, L3-4, L4-5, L5-S1. Pt has a past medical history including Arthritis; Cancer; Hypertension.    Clinical Impression   PTA Pt independent in ADL and mobility. Pt currently max A for ADL and min A for ambulation with RW. Please see performance level below. Pt will benefit from skilled OT in the acute setting to maximize safety and independence in ADL as well as reinforce back precautions during functional transfers and provide family/caregiver education. Pt will require HHOT upon dc to return to PLOF in safe manner and continue education for ADL independence.    Follow Up Recommendations  Home health OT;Supervision/Assistance - 24 hour    Equipment Recommendations  3 in 1 bedside commode    Recommendations for Other Services       Precautions / Restrictions Precautions Precautions: Back Precaution Booklet Issued: Yes (comment) Precaution Comments: handout reviewed in full with Pt and wife Required Braces or Orthoses: [name] [name]: Applied in sitting position;Lumbar corset Restrictions Weight Bearing Restrictions: No      Mobility Bed Mobility               General bed mobility comments: Pt OOB in chair when therapy arrived in the room, educated Pt and wife on rolling and side laying to sit technique with precautions - handout provided as well  Transfers Overall transfer level: Needs assistance Equipment used: Rolling walker (2 wheeled) Transfers: Sit to/from Stand Sit to Stand: Min assist         General transfer comment: good hand placement, increased time required    Balance Overall balance assessment: Needs assistance Sitting-balance support: Single extremity supported;Feet supported Sitting balance-Leahy Scale: Fair Sitting balance -  Comments: sitting with no back support in recliner for don/doff Frank   Standing balance support: Bilateral upper extremity supported;During functional activity Standing balance-Leahy Scale: Poor Standing balance comment: reliant on RW                            ADL either performed or assessed with clinical judgement   ADL Overall ADL's : Needs assistance/impaired     Grooming: Set up   Upper Body Bathing: Moderate assistance   Lower Body Bathing: Maximal assistance   Upper Body Dressing : Moderate assistance;Sitting Upper Body Dressing Details (indicate cue type and reason): to don Frank, wife educated in proper donning/doff Lower Body Dressing: Maximal assistance   Toilet Transfer: Minimal assistance;Cueing for sequencing;Ambulation;BSC;RW Toilet Transfer Details (indicate cue type and reason): simulated with recliner Toileting- Clothing Manipulation and Hygiene: Maximal assistance;Sit to/from stand   Tub/ Shower Transfer: Walk-in shower;Minimal assistance;Ambulation;Shower Technical sales engineer Details (indicate cue type and reason): Wife and Pt educated that she will need to be present for safety Functional mobility during ADLs: Minimal assistance;Rolling walker;Cueing for sequencing General ADL Comments: Pt's wife has back issues too, so getting down to help is not easy for her     Vision   Vision Assessment?: No apparent visual deficits     Perception     Praxis      Pertinent Vitals/Pain Pain Assessment: 0-10 Pain Score: 7  Pain Location: abdomen, side and belly incisions Pain Descriptors / Indicators: Sore Pain Intervention(s): Monitored during session;Repositioned;Premedicated before session     Hand Dominance Right   Extremity/Trunk  Assessment Upper Extremity Assessment Upper Extremity Assessment: Overall WFL for tasks assessed (L hand swollen, suspect to IV - RN notified)   Lower Extremity Assessment Lower Extremity  Assessment: Defer to PT evaluation   Cervical / Trunk Assessment Cervical / Trunk Assessment: Other exceptions Cervical / Trunk Exceptions: s/p surgeries   Communication Communication Communication: No difficulties   Cognition Arousal/Alertness: Suspect due to medications Behavior During Therapy: WFL for tasks assessed/performed;Flat affect Overall Cognitive Status: Within Functional Limits for tasks assessed                                 General Comments: Pt slightly lethargic during session, wife providing most of background information   General Comments  wife present for session, all education    Exercises     Shoulder Instructions      Home Living Family/patient expects to be discharged to:: Private residence Living Arrangements: Spouse/significant other Available Help at Discharge: Family Type of Home: House Home Access: Stairs to enter Technical brewer of Steps: 1   Home Layout: Able to live on main level with bedroom/bathroom;Two level;Full bath on main level;Laundry or work area in Building surveyor of Steps: can stay on main   Bathroom Shower/Tub: Occupational psychologist: Handicapped height (chair height)     Home Equipment: Environmental consultant - 2 wheels;Hand held shower head;Shower seat - built in (working on Customer service manager bars)          Prior Functioning/Environment Level of Independence: Independent        Comments: walks 2 miles a day        OT Problem List: Decreased range of motion;Decreased activity tolerance;Impaired balance (sitting and/or standing);Decreased safety awareness;Decreased knowledge of use of DME or AE;Decreased knowledge of precautions;Pain      OT Treatment/Interventions: Self-care/ADL training;DME and/or AE instruction;Therapeutic activities;Patient/family education;Balance training    OT Goals(Current goals can be found in the care plan section) Acute Rehab OT Goals Patient Stated  Goal: to have no pain, and get back to walking OT Goal Formulation: With patient/family Time For Goal Achievement: 02/20/17 Potential to Achieve Goals: Good ADL Goals Pt Will Perform Upper Body Bathing: with supervision;with caregiver independent in assisting;sitting Pt Will Perform Lower Body Bathing: with supervision;with caregiver independent in assisting;with adaptive equipment;sit to/from stand Pt Will Perform Upper Body Dressing: with supervision;with caregiver independent in assisting;sitting Pt Will Perform Lower Body Dressing: with supervision;with caregiver independent in assisting;with adaptive equipment;sit to/from stand Pt Will Transfer to Toilet: with supervision;ambulating (with caregiver independent in assisting; BSC over toilet; RW) Pt Will Perform Toileting - Clothing Manipulation and hygiene: with supervision;sit to/from stand;with caregiver independent in assisting Additional ADL Goal #1: Pt will recall 3/3 back precautions prior to starting therapy session Additional ADL Goal #2: Pt will perform bed mobility using rollingside laying to sit technique at supervision level.  OT Frequency: Min 2X/week   Barriers to D/C:            Co-evaluation PT/OT/SLP Co-Evaluation/Treatment: Yes Reason for Co-Treatment: For patient/therapist safety;To address functional/ADL transfers   OT goals addressed during session: ADL's and self-care      End of Session Equipment Utilized During Treatment: Gait belt;Rolling walker;Back Frank Nurse Communication: Mobility status;Precautions  Activity Tolerance: Patient tolerated treatment well Patient left: in chair;with call bell/phone within reach;with family/visitor present  OT Visit Diagnosis: Unsteadiness on feet (R26.81);Pain Pain - Right/Left:  (central) Pain - part of body:  (  Back, stomach, side)                Time: 1950-9326 OT Time Calculation (min): 24 min Charges:  OT General Charges $OT Visit: 1 Procedure OT  Evaluation $OT Eval Moderate Complexity: 1 Procedure G-Codes:     Hulda Humphrey OTR/L [place] 02/06/2017, 10:59 AM

## 2017-02-07 NOTE — Care Management Note (Addendum)
Case Management Note  Patient Details  Name: Frank [name] MRN: [id number] Date of Birth: 14-Dec-1945  Subjective/Objective:               Spoke to patient's wife at the bedside. PT rec HH. Would like to use [facility], referral made to [facility] clinical liaison [place]. Order placed for RW.   Spoke with answering service requested order for face to face and [facility] PT.    Action/Plan:  DC to home with [facility] and DME.  Expected Discharge Date:  02/07/17               Expected Discharge Plan:  [place]  In-House Referral:     Discharge planning Services  CM Consult  Post Acute Care Choice:  Durable Medical Equipment, Home Health Choice offered to:  Spouse  DME Arranged:    DME Agency:  [place] Arranged:  PT [facility] [place] Agency:  [place]  Status of Service:  Completed, signed off  If discussed at [place] of Stay Meetings, dates discussed:    Additional Comments:  Carles Collet, RN 02/07/2017, 9:01 AM

## 2017-02-07 NOTE — Progress Notes (Signed)
Waiting for ride and DME

## 2017-02-07 NOTE — Progress Notes (Signed)
Patient ambulated in hallway with brace and walker.

## 2017-02-07 NOTE — Progress Notes (Addendum)
qPhysical Therapy Treatment Patient Details Name: Frank [name] MRN: [id number] DOB: [birth year] Today's Date: 02/07/2017    History of Present Illness Pt is a 71 y/o male s/p Posterior spinal fusion, L2-3, L3-4, L4-5, L5-S1. Pt has a past medical history including Arthritis; Cancer; Hypertension.     PT Comments    Patient progressing well with mobility. Educated patient and spouse regarding mobility expectations, car transfers, safety and positioning. Will continue to see as indicated. Anticipate patient will be safe for d/c home when discharged.   Follow Up Recommendations  Home health PT     Equipment Recommendations  None recommended by PT    Recommendations for Other Services       Precautions / Restrictions Precautions Precautions: Back Precaution Booklet Issued: Yes (comment) Precaution Comments: Pt able to recall 2/3 precautions, wife assisted with 3rd Required Braces or Orthoses: Spinal Brace Spinal Brace: Thoracolumbosacral orthotic;Applied in sitting position Restrictions Weight Bearing Restrictions: No    Mobility  Bed Mobility       General bed mobility comments: received in chair   Transfers Overall transfer level: Needs assistance Equipment used: Rolling walker (2 wheeled) Transfers: Sit to/from Stand Sit to Stand: Min assist         General transfer comment: vc for hand placement when coming up from bed, good follow through for rise from 3 in 1  Ambulation/Gait Ambulation/Gait assistance: Supervision Ambulation Distance (Feet): 210 Feet Assistive device: Rolling walker (2 wheeled) Gait Pattern/deviations: Step-through pattern;Decreased stride length;Shuffle;Trunk flexed Gait velocity: decrease Gait velocity interpretation: Below normal speed for age/gender General Gait Details: VCs for posture and relaxation of UE. Additional cues for increased gait speed   Stairs            Wheelchair Mobility    Modified Rankin (Stroke  Patients Only)       Balance Overall balance assessment: Needs assistance Sitting-balance support: Single extremity supported;Feet supported Sitting balance-Leahy Scale: Fair Sitting balance - Comments: sitting EOB with no back support   Standing balance support: Bilateral upper extremity supported;No upper extremity supported;During functional activity Standing balance-Leahy Scale: Fair Standing balance comment: Use of RW for comfort                            Cognition Arousal/Alertness: Awake/alert Behavior During Therapy: WFL for tasks assessed/performed;Flat affect Overall Cognitive Status: Within Functional Limits for tasks assessed                                        Exercises      General Comments General comments (skin integrity, edema, etc.): educated on mobility expectations, car transfer technique and positioning      Pertinent Vitals/Pain Pain Assessment: 0-10 Pain Score: 7  Pain Location: incisional sites  Pain Descriptors / Indicators: Sore Pain Intervention(s): Monitored during session    Home Living                      Prior Function            PT Goals (current goals can now be found in the care plan section) Acute Rehab PT Goals Patient Stated Goal: to have no pain, and get back to walking PT Goal Formulation: With patient/family Time For Goal Achievement: 02/13/17 Potential to Achieve Goals: Good Progress towards PT goals: Progressing toward goals    Frequency  Min 5X/week      PT Plan Current plan remains appropriate    Co-evaluation             End of Session Equipment Utilized During Treatment: Back brace;Gait belt Activity Tolerance: Patient limited by fatigue Patient left: in chair;with call bell/phone within reach;with family/visitor present   PT Visit Diagnosis: Difficulty in walking, not elsewhere classified (R26.2);Muscle weakness (generalized) (M62.81)     Time:  7619-5093 PT Time Calculation (min) (ACUTE ONLY): 18 min  Charges:  $Gait Training: 8-22 mins                    G Codes:        Alben Deeds, PT DPT  [phone]   Duncan Dull 02/07/2017, 9:45 AM

## 2017-02-07 NOTE — Progress Notes (Signed)
D/C to home w/ wife and Dtr.All d/c instructions  given w/ verbal understanding.DME and other belongings given as well as prescriptions x2.

## 2017-02-07 NOTE — Progress Notes (Signed)
    Patient doing well Has been progressing with PT Patient reports resolution of his preop bilateral leg pain Minimal pain   Physical Exam: Vitals:   02/07/17 0109 02/07/17 0537  BP: 127/68 132/64  Pulse: 76 75  Resp: 16 16  Temp: 98.6 F (37 C) 98.6 F (37 C)   Patient looks excellent Dressing in place NVI  POD #2 and 3 s/p L2-S1 A/P fusion, doing well  - up with PT/OT, encourage ambulation - Percocet for pain, Valium for muscle spasms - likely d/c home later today

## 2017-02-07 NOTE — Progress Notes (Signed)
Occupational Therapy Treatment Patient Details Name: Frank [name] MRN: [id number] DOB: [birth year] Today's Date: 02/07/2017    History of present illness Pt is a 72 y/o male s/p Posterior spinal fusion, L2-3, L3-4, L4-5, L5-S1. Pt has a past medical history including Arthritis; Cancer; Hypertension.    OT comments  Pt making progress towards OT goals this session. Focus on reinforcing back precautions and AE education. Pt able to practice with each piece from AE kit and toileting aide. Pt and wife had no questions or concerns for OT at the end of the session, and their daughter who is an ICU nurse will be with them for the next week to assist in care giving.  Follow Up Recommendations  Supervision/Assistance - 24 hour (initially)    Equipment Recommendations  3 in 1 bedside commode;Other (comment) (AE kit (wide sock donner); toileting aide)    Recommendations for Other Services      Precautions / Restrictions Precautions Precautions: Back Precaution Booklet Issued: Yes (comment) Precaution Comments: Pt able to recall 2/3 precautions, wife assisted with 3rd Required Braces or Orthoses: Spinal Brace Spinal Brace: Thoracolumbosacral orthotic;Applied in sitting position Restrictions Weight Bearing Restrictions: No       Mobility Bed Mobility Overal bed mobility: Needs Assistance Bed Mobility: Rolling;Sidelying to Sit Rolling: Supervision Sidelying to sit: Min assist       General bed mobility comments: min A for trunk elevation  Transfers Overall transfer level: Needs assistance Equipment used: Rolling walker (2 wheeled) Transfers: Sit to/from Stand Sit to Stand: Min assist         General transfer comment: vc for hand placement when coming up from bed, good follow through for rise from 3 in 1    Balance Overall balance assessment: Needs assistance Sitting-balance support: Single extremity supported;Feet supported Sitting balance-Leahy Scale: Fair Sitting  balance - Comments: sitting EOB with no back support   Standing balance support: Bilateral upper extremity supported;No upper extremity supported;During functional activity Standing balance-Leahy Scale: Fair Standing balance comment: able to pull underwear up and down without LOB                           ADL either performed or assessed with clinical judgement   ADL Overall ADL's : Needs assistance/impaired             Lower Body Bathing: With caregiver independent assisting;With adaptive equipment;Min guard;Sit to/from stand Lower Body Bathing Details (indicate cue type and reason): Pt educated in long handle sponge Upper Body Dressing : Moderate assistance;Sitting Upper Body Dressing Details (indicate cue type and reason): to don brace, wife educated in proper donning/doff Lower Body Dressing: Minimal assistance;With caregiver independent assisting;With adaptive equipment;Sit to/from stand Lower Body Dressing Details (indicate cue type and reason): Pt educated in Manufacturing engineer, sock donner, shoe horn Toilet Transfer: Min guard;Ambulation;BSC;RW (BSC over toilet) Toilet Transfer Details (indicate cue type and reason): in bathroom Toileting- Clothing Manipulation and Hygiene: Min guard;Sit to/from stand Toileting - Clothing Manipulation Details (indicate cue type and reason): to bring underwear down t knees and back up again for seated toileting. Pt educated in toileting aide for rear peri care Tub/ Shower Transfer: Walk-in shower;Min guard;With caregiver independent assisting;Ambulation;Shower Technical sales engineer Details (indicate cue type and reason): Wife and Pt educated that she will need to be present for safety Functional mobility during ADLs: Min guard;Rolling walker General ADL Comments: Pt will need wide sock donner, left with RN so that when they purchase  AE kit they can switch out regular for wide.     Vision   Vision Assessment?: No  apparent visual deficits   Perception     Praxis      Cognition Arousal/Alertness: Awake/alert Behavior During Therapy: WFL for tasks assessed/performed;Flat affect Overall Cognitive Status: Within Functional Limits for tasks assessed                                          Exercises     Shoulder Instructions       General Comments Wife present for session, says that she will purchase AE kit    Pertinent Vitals/ Pain       Pain Assessment: 0-10 Pain Score: 7  Pain Location: abdomen, side and belly incisions Pain Descriptors / Indicators: Sore Pain Intervention(s): Monitored during session;Repositioned;RN gave pain meds during session  Home Living                                          Prior Functioning/Environment              Frequency  Min 2X/week        Progress Toward Goals  OT Goals(current goals can now be found in the care plan section)  Progress towards OT goals: Progressing toward goals  Acute Rehab OT Goals Patient Stated Goal: to have no pain, and get back to walking OT Goal Formulation: With patient/family Time For Goal Achievement: 02/20/17 Potential to Achieve Goals: Good  Plan Discharge plan needs to be updated    Co-evaluation                 End of Session Equipment Utilized During Treatment: Gait belt;Rolling walker;Back brace  OT Visit Diagnosis: Unsteadiness on feet (R26.81);Pain Pain - Right/Left:  (central) Pain - part of body:  (back, stomach side)   Activity Tolerance Patient tolerated treatment well   Patient Left in chair;with call bell/phone within reach;with family/visitor present   Nurse Communication Mobility status;Precautions (left with wide sock donner to switch out once they buy kit)        Time: 9735-3299 OT Time Calculation (min): 28 min  Charges: OT General Charges $OT Visit: 1 Procedure OT Treatments $Self Care/Home Management : 23-37 mins  Hulda Humphrey OTR/L [place] 02/07/2017, 9:22 AM

## 2017-02-09 ENCOUNTER — Encounter (HOSPITAL_COMMUNITY): Payer: Self-pay | Admitting: Orthopedic Surgery

## 2017-02-10 ENCOUNTER — Encounter (HOSPITAL_COMMUNITY): Payer: Self-pay | Admitting: Orthopedic Surgery

## 2017-02-17 ENCOUNTER — Encounter (HOSPITAL_COMMUNITY): Payer: Self-pay | Admitting: Emergency Medicine

## 2017-02-17 ENCOUNTER — Inpatient Hospital Stay (HOSPITAL_COMMUNITY)
Admission: EM | Admit: 2017-02-17 | Discharge: 2017-02-20 | DRG: 872 | Disposition: A | Discharge status: Home Health | Payer: Medicare Other | Attending: Family Medicine | Admitting: Family Medicine

## 2017-02-17 DIAGNOSIS — N179 Acute kidney failure, unspecified: Secondary | ICD-10-CM | POA: Diagnosis present

## 2017-02-17 DIAGNOSIS — G4733 Obstructive sleep apnea (adult) (pediatric): Secondary | ICD-10-CM | POA: Diagnosis present

## 2017-02-17 DIAGNOSIS — N390 Urinary tract infection, site not specified: Secondary | ICD-10-CM | POA: Diagnosis present

## 2017-02-17 DIAGNOSIS — D649 Anemia, unspecified: Secondary | ICD-10-CM | POA: Diagnosis not present

## 2017-02-17 DIAGNOSIS — Z87442 Personal history of urinary calculi: Secondary | ICD-10-CM

## 2017-02-17 DIAGNOSIS — M5416 Radiculopathy, lumbar region: Secondary | ICD-10-CM | POA: Diagnosis not present

## 2017-02-17 DIAGNOSIS — B9620 Unspecified Escherichia coli [E. coli] as the cause of diseases classified elsewhere: Secondary | ICD-10-CM | POA: Diagnosis present

## 2017-02-17 DIAGNOSIS — I10 Essential (primary) hypertension: Secondary | ICD-10-CM | POA: Diagnosis not present

## 2017-02-17 DIAGNOSIS — R6520 Severe sepsis without septic shock: Secondary | ICD-10-CM | POA: Diagnosis present

## 2017-02-17 DIAGNOSIS — R7989 Other specified abnormal findings of blood chemistry: Secondary | ICD-10-CM | POA: Diagnosis present

## 2017-02-17 DIAGNOSIS — I959 Hypotension, unspecified: Secondary | ICD-10-CM | POA: Diagnosis present

## 2017-02-17 DIAGNOSIS — R059 Cough, unspecified: Secondary | ICD-10-CM

## 2017-02-17 DIAGNOSIS — Z79899 Other long term (current) drug therapy: Secondary | ICD-10-CM

## 2017-02-17 DIAGNOSIS — K5900 Constipation, unspecified: Secondary | ICD-10-CM | POA: Diagnosis present

## 2017-02-17 DIAGNOSIS — Z981 Arthrodesis status: Secondary | ICD-10-CM

## 2017-02-17 DIAGNOSIS — G4730 Sleep apnea, unspecified: Secondary | ICD-10-CM | POA: Diagnosis present

## 2017-02-17 DIAGNOSIS — Z9889 Other specified postprocedural states: Secondary | ICD-10-CM

## 2017-02-17 DIAGNOSIS — A4151 Sepsis due to Escherichia coli [E. coli]: Secondary | ICD-10-CM | POA: Diagnosis not present

## 2017-02-17 DIAGNOSIS — E861 Hypovolemia: Secondary | ICD-10-CM | POA: Diagnosis present

## 2017-02-17 DIAGNOSIS — R066 Hiccough: Secondary | ICD-10-CM | POA: Diagnosis present

## 2017-02-17 DIAGNOSIS — N10 Acute tubulo-interstitial nephritis: Secondary | ICD-10-CM | POA: Diagnosis present

## 2017-02-17 DIAGNOSIS — R140 Abdominal distension (gaseous): Secondary | ICD-10-CM

## 2017-02-17 DIAGNOSIS — Z85038 Personal history of other malignant neoplasm of large intestine: Secondary | ICD-10-CM

## 2017-02-17 DIAGNOSIS — R05 Cough: Secondary | ICD-10-CM

## 2017-02-17 LAB — CBC WITH DIFFERENTIAL/PLATELET
Basophils Absolute: 0 K/uL (ref 0.0–0.1)
Basophils Relative: 0 %
Eosinophils Absolute: 0 K/uL (ref 0.0–0.7)
Eosinophils Relative: 0 %
HCT: 27.4 % — ABNORMAL LOW (ref 39.0–52.0)
Hemoglobin: 8.6 g/dL — ABNORMAL LOW (ref 13.0–17.0)
Lymphocytes Relative: 7 %
Lymphs Abs: 1.2 K/uL (ref 0.7–4.0)
MCH: 28.2 pg (ref 26.0–34.0)
MCHC: 31.4 g/dL (ref 30.0–36.0)
MCV: 89.8 fL (ref 78.0–100.0)
Monocytes Absolute: 1.4 K/uL — ABNORMAL HIGH (ref 0.1–1.0)
Monocytes Relative: 8 %
Neutro Abs: 15.3 K/uL — ABNORMAL HIGH (ref 1.7–7.7)
Neutrophils Relative %: 85 %
Platelets: 444 K/uL — ABNORMAL HIGH (ref 150–400)
RBC: 3.05 MIL/uL — ABNORMAL LOW (ref 4.22–5.81)
RDW: 13.9 % (ref 11.5–15.5)
WBC: 17.8 K/uL — ABNORMAL HIGH (ref 4.0–10.5)

## 2017-02-17 LAB — URINALYSIS, ROUTINE W REFLEX MICROSCOPIC
Bilirubin Urine: NEGATIVE
Glucose, UA: NEGATIVE mg/dL
Hgb urine dipstick: NEGATIVE
Ketones, ur: NEGATIVE mg/dL
Nitrite: NEGATIVE
Protein, ur: 30 mg/dL — AB
Specific Gravity, Urine: 1.019 (ref 1.005–1.030)
pH: 5 (ref 5.0–8.0)

## 2017-02-17 LAB — COMPREHENSIVE METABOLIC PANEL WITH GFR
ALT: 23 U/L (ref 17–63)
AST: 30 U/L (ref 15–41)
Albumin: 3.2 g/dL — ABNORMAL LOW (ref 3.5–5.0)
Alkaline Phosphatase: 129 U/L — ABNORMAL HIGH (ref 38–126)
Anion gap: 11 (ref 5–15)
BUN: 18 mg/dL (ref 6–20)
CO2: 23 mmol/L (ref 22–32)
Calcium: 8.8 mg/dL — ABNORMAL LOW (ref 8.9–10.3)
Chloride: 102 mmol/L (ref 101–111)
Creatinine, Ser: 1.52 mg/dL — ABNORMAL HIGH (ref 0.61–1.24)
GFR calc Af Amer: 52 mL/min — ABNORMAL LOW (ref >60)
GFR calc non Af Amer: 45 mL/min — ABNORMAL LOW (ref >60)
Glucose, Bld: 126 mg/dL — ABNORMAL HIGH (ref 65–99)
Potassium: 4.3 mmol/L (ref 3.5–5.1)
Sodium: 136 mmol/L (ref 135–145)
Total Bilirubin: 1.4 mg/dL — ABNORMAL HIGH (ref 0.3–1.2)
Total Protein: 7.1 g/dL (ref 6.5–8.1)

## 2017-02-17 LAB — PROTIME-INR
INR: 1.24
Prothrombin Time: 15.6 s — ABNORMAL HIGH (ref 11.4–15.2)

## 2017-02-17 LAB — I-STAT CG4 LACTIC ACID, ED
Lactic Acid, Venous: 1.19 mmol/L (ref 0.5–1.9)
Lactic Acid, Venous: 0.97 mmol/L (ref 0.5–1.9)

## 2017-02-17 MED ORDER — ACETAMINOPHEN 650 MG RE SUPP: 650.0000 mg | Freq: Four times a day (QID) | RECTAL | Status: DC | PRN

## 2017-02-17 MED ORDER — ACETAMINOPHEN 325 MG PO TABS: 650.0000 mg | ORAL_TABLET | Freq: Four times a day (QID) | ORAL | Status: DC | PRN

## 2017-02-17 MED ORDER — SODIUM CHLORIDE 0.9% FLUSH
3.0000 mL | Freq: Two times a day (BID) | INTRAVENOUS | Status: DC
  Administered 2017-02-19 – 2017-02-20 (×3): 3 mL via INTRAVENOUS

## 2017-02-17 MED ORDER — ACETAMINOPHEN 500 MG PO TABS
1000.0000 mg | ORAL_TABLET | Freq: Once | ORAL | Status: AC
  Administered 2017-02-17: 1000 mg via ORAL
  Filled 2017-02-17: qty 2

## 2017-02-17 MED ORDER — VITAMIN B-12 1000 MCG PO TABS
500.0000 ug | ORAL_TABLET | Freq: Every day | ORAL | Status: DC
  Administered 2017-02-18 – 2017-02-20 (×3): 500 ug via ORAL
  Filled 2017-02-17 (×4): qty 1

## 2017-02-17 MED ORDER — SODIUM CHLORIDE 0.9 % IV SOLN: INTRAVENOUS | Status: DC

## 2017-02-17 MED ORDER — DEXTROSE 5 % IV SOLN
2.0000 g | Freq: Once | INTRAVENOUS | Status: AC
  Administered 2017-02-17: 2 g via INTRAVENOUS
  Filled 2017-02-17: qty 2

## 2017-02-17 MED ORDER — DEXTROSE 5 % IV SOLN
1.0000 g | Freq: Three times a day (TID) | INTRAVENOUS | Status: DC
  Administered 2017-02-18: 1 g via INTRAVENOUS
  Filled 2017-02-17 (×2): qty 1

## 2017-02-17 MED ORDER — SODIUM CHLORIDE 0.9 % IV BOLUS (SEPSIS)
500.0000 mL | Freq: Once | INTRAVENOUS | Status: AC
  Administered 2017-02-17: 500 mL via INTRAVENOUS

## 2017-02-17 MED ORDER — ONDANSETRON HCL 4 MG/2ML IJ SOLN: 4.0000 mg | Freq: Four times a day (QID) | INTRAMUSCULAR | Status: DC | PRN

## 2017-02-17 MED ORDER — SODIUM CHLORIDE 0.9 % IV BOLUS (SEPSIS)
1000.0000 mL | Freq: Once | INTRAVENOUS | Status: AC
  Administered 2017-02-17: 1000 mL via INTRAVENOUS

## 2017-02-17 MED ORDER — OXYCODONE-ACETAMINOPHEN 5-325 MG PO TABS
1.0000 | ORAL_TABLET | Freq: Four times a day (QID) | ORAL | Status: DC | PRN
  Administered 2017-02-18 – 2017-02-19 (×4): 2 via ORAL
  Filled 2017-02-17 (×4): qty 2

## 2017-02-17 MED ORDER — SODIUM CHLORIDE 0.9 % IV SOLN
INTRAVENOUS | Status: AC
  Administered 2017-02-18 (×2): via INTRAVENOUS

## 2017-02-17 MED ORDER — ONE DAILY MULTIVITAMIN/IRON PO TABS: 1.0000 | ORAL_TABLET | Freq: Every day | ORAL | Status: DC

## 2017-02-17 MED ORDER — ONDANSETRON HCL 4 MG PO TABS: 4.0000 mg | ORAL_TABLET | Freq: Four times a day (QID) | ORAL | Status: DC | PRN

## 2017-02-17 MED ORDER — DEXTROSE 5 % IV SOLN
1.0000 g | Freq: Once | INTRAVENOUS | Status: AC
  Administered 2017-02-17: 1 g via INTRAVENOUS
  Filled 2017-02-17: qty 10

## 2017-02-17 MED ORDER — ATORVASTATIN CALCIUM 40 MG PO TABS
40.0000 mg | ORAL_TABLET | Freq: Every day | ORAL | Status: DC
Start: 2017-02-18 — End: 2017-02-20
  Administered 2017-02-18 – 2017-02-19 (×2): 40 mg via ORAL
  Filled 2017-02-17 (×2): qty 1

## 2017-02-17 MED ORDER — DIAZEPAM 5 MG PO TABS
5.0000 mg | ORAL_TABLET | Freq: Four times a day (QID) | ORAL | Status: DC | PRN
Start: 2017-02-17 — End: 2017-02-20
  Administered 2017-02-18 – 2017-02-19 (×2): 5 mg via ORAL
  Filled 2017-02-17 (×2): qty 1

## 2017-02-17 NOTE — ED Notes (Signed)
Pt stated when he got up to urinate, he began to feel chilled and "shiver". Oral temp 98.8, EDP aware.

## 2017-02-17 NOTE — ED Notes (Signed)
Dr. Myna Hidalgo paged

## 2017-02-17 NOTE — ED Notes (Signed)
Dr. Myna Hidalgo informed of pts HR of 119 and rectal temp of 104.3

## 2017-02-17 NOTE — ED Triage Notes (Signed)
Pt here with fever and hypotension after having back sx 2 weeks ago; pt sts increased pain

## 2017-02-17 NOTE — H&P (Signed)
History and Physical    Frank [name] MVH:[id number] DOB: [birth year] DOA: 02/17/2017  PCP: Wende Neighbors, MD   Patient coming from: Home, by way of orthopedic surgeon's office   Chief Complaint: Fever, chills, fatigue   HPI: Frank [name] is a 71 y.o. male with medical history significant for obstructive sleep apnea using CPAP, hypertension, hyperlipidemia, and lumbar radiculopathy status post recent lumbar fusion surgery who presents to the emergency department for evaluation of fevers, chills, and lethargy. Patient is accompanied by his wife and daughter who assist with the history. He had reportedly been doing well since the surgery was performed on his lumbar spine approximately 2 weeks ago. Yesterday, his wife noted that he seemed to be more tired than usual and the patient developed chills last night. Today, patient was much more fatigued than usual and continued to have subjective fevers and chills. He went to his physical therapy appointment. Was found to be ill-appearing and directed to the orthopedic surgeon's office for evaluation of possible postoperative complication. Patient was evaluated in the orthopedic surgeon's clinic with radiographs of the chest and surgical sites which appeared nonacute. Incision sites appeared appropriate to the surgeon and the patient was directed to the ED for further evaluation of his complaints. [name] denies any lower extremity pain or swelling, denies chest pain or palpitations, and denies dyspnea or cough. He also denies rhinorrhea or sore throat. He notes pain in the bilateral groin, but no dysuria or flank pain. He denies any sick contacts.  ED Course: Upon arrival to the ED, patient is found to be afebrile initially, saturating well on room air, blood pressure of 87/54, and normal heart rate and respirations. Chemistry panels notable for a serum creatinine 1.52, up from 0.87 last month. CBC is notable for a leukocytosis to 17,800, normocytic anemia  with hemoglobin of 8.6, and thrombocytosis with platelets 444,000. Lactic acid is reassuring at 1.19. Urinalysis is consistent with infection and sample was sent for culture. Chest x-ray was performed at the orthopedic surgeon's office and radiology indicates that it is a negative study. Blood cultures were obtained, 2 L of normal saline were administered, and the patient was started on empiric Rocephin. Blood pressure improved and remained stable in the ED and the patient has not been in any respiratory distress. ED physician discussed the drop in hemoglobin with the patient's surgeon who felt that it was an appropriate drop given the two 6-hour surgeries that the patient underwent. Patient will be observed on the medical/surgical unit for ongoing evaluation and management of UTI with acute kidney injury.  Review of Systems:  All other systems reviewed and apart from HPI, are negative.  Past Medical History:  Diagnosis Date  . Arthritis    left knee  . Cancer [facility])    colon cancer  . History of kidney stones   . Hypercholesteremia   . Hypertension   . Sleep apnea    uses cpap    Past Surgical History:  Procedure Laterality Date  . ABDOMINAL EXPOSURE N/A 02/04/2017   Procedure: ABDOMINAL EXPOSURE;  Surgeon: Rosetta Posner, MD;  Location: [place];  Service: Vascular;  Laterality: N/A;  . ANTERIOR LAT LUMBAR FUSION Right 02/04/2017   Procedure: RIGHT SIDED LUMBAR 3-4, LUMBAR 4-5 LATERAL INTERBODY FUSION WITH INSTRUMENTATION AND ALLOGRAFT;  Surgeon: Phylliss Bob, MD;  Location: [place];  Service: Orthopedics;  Laterality: Right;  LEFT OR RIGHT SIDED LUMBAR 2-3, LUMBAR 3-4 LATERAL INTERBODY FUSION WITH INSTRUMENTATION AND ALLOGRAFT  .  ANTERIOR LUMBAR FUSION N/A 02/04/2017   Procedure: LUMBAR 5-SACRUM 1 ANTERIOR LUMBAR FUSION WITH INSTRUMENTATION AND ALLOGRAFT;  Surgeon: Phylliss Bob, MD;  Location: [place];  Service: Orthopedics;  Laterality: N/A;  LUMBAR 4-5, LUMBAR 5-SACRUM 1 ANTERIOR LUMBAR FUSION  WITH INSTRUMENTATION AND ALLOGRAFT  . COLON SURGERY     hx colon ca  . COLONOSCOPY N/A 04/24/2014   Procedure: COLONOSCOPY;  Surgeon: Daneil Dolin, MD;  [facility];  Service: Endoscopy;  Laterality: N/A;  8:45 AM  . HERNIA REPAIR     bilateral  . POSTERIOR LUMBAR FUSION 4 LEVEL Bilateral 02/05/2017   Procedure: LUMBAR 2-3, LUMBAR 3-4, LUMBAR 4-5, LUMBAR 5-SACRUM 1 POSTERIOR SPINAL FUSION WITH INSTRUMENTATION AND ALLOGRAFT;  Surgeon: Phylliss Bob, MD;  Location: [place];  Service: Orthopedics;  Laterality: Bilateral;  LUMBAR 2-3, LUMBAR 3-4, LUMBAR 4-5, LUMBAR 5-SACRUM 1 POSTERIOR SPINAL FUSION WITH INSTRUMENTATION AND ALLOGRAFT     reports that he has never smoked. He has never used smokeless tobacco. He reports that he does not drink alcohol or use drugs.  Allergies  Allergen Reactions  . No Known Allergies     History reviewed. No pertinent family history.   Prior to Admission medications   Medication Sig Start Date End Date Taking? Authorizing Provider  atorvastatin (LIPITOR) 40 MG tablet Take 40 mg by mouth at bedtime.   Yes Historical Provider, MD  cyanocobalamin 500 MCG tablet Take 500 mcg by mouth daily.   Yes Historical Provider, MD  diazepam (VALIUM) 5 MG tablet Take 5 mg by mouth every 6 (six) hours as needed for muscle spasms.  02/07/17  Yes Historical Provider, MD  lisinopril (PRINIVIL,ZESTRIL) 20 MG tablet Take 20 mg by mouth daily.   Yes Historical Provider, MD  Multiple Vitamins-Iron (ONE DAILY MULTIVITAMIN/IRON) TABS Take 1 tablet by mouth daily with breakfast.   Yes Historical Provider, MD  oxyCODONE-acetaminophen (PERCOCET/ROXICET) 5-325 MG tablet Take 1-2 tablets by mouth every 6 (six) hours as needed. Patient taking differently: Take 1-2 tablets by mouth every 6 (six) hours as needed (for pain).  12/29/15  Yes Dorie Rank, MD  vitamin C (ASCORBIC ACID) 500 MG tablet Take 500 mg by mouth daily.   Yes Historical Provider, MD    Physical Exam: Vitals:    02/17/17 2040 02/17/17 2100 02/17/17 2224 02/17/17 2230  BP:   (!) 144/68   Pulse:  (!) 117 (!) 119 (!) 119  Resp:  (!) 22 17 (!) 24  Temp: 100.1 F (37.8 C)  (!) 104.3 F (40.2 C)   TempSrc: Oral  Rectal   SpO2:  93%  94%  Weight:      Height:          Constitutional: No respiratory distress. Appears uncomfortable, rigors noted.  Eyes: PERTLA, lids and conjunctivae normal ENMT: Mucous membranes are moist. Posterior pharynx clear of any exudate or lesions.   Neck: normal, supple, no masses, no thyromegaly Respiratory: clear to auscultation bilaterally, no wheezing, no crackles. Normal respiratory effort.   Cardiovascular: S1 & S2 heard, regular rate and rhythm. Trace pretibial edema bilaterally. No significant JVD. Abdomen: No distension, soft, minimal tenderness at surgical incision site which appears c/d/i. No masses palpated. Bowel sounds active.  Musculoskeletal: no clubbing / cyanosis. No joint deformity upper and lower extremities.   Skin: no significant rashes, lesions, ulcers. Warm, dry, well-perfused. Neurologic: CN 2-12 grossly intact. Sensation intact, DTR normal. Strength 5/5 in all 4 limbs.  Psychiatric: Somnolent, arousable. Oriented x 3.  Labs on Admission: I have personally reviewed following labs and imaging studies  CBC:  Recent Labs Lab 02/17/17 1453  WBC 17.8*  NEUTROABS 15.3*  HGB 8.6*  HCT 27.4*  MCV 89.8  PLT 841*   Basic Metabolic Panel:  Recent Labs Lab 02/17/17 1453  NA 136  K 4.3  CL 102  CO2 23  GLUCOSE 126*  BUN 18  CREATININE 1.52*  CALCIUM 8.8*   GFR: Estimated Creatinine Clearance: 52.2 mL/min (A) (by C-G formula based on SCr of 1.52 mg/dL (H)). Liver Function Tests:  Recent Labs Lab 02/17/17 1453  AST 30  ALT 23  ALKPHOS 129*  BILITOT 1.4*  PROT 7.1  ALBUMIN 3.2*   No results for input(s): LIPASE, AMYLASE in the last 168 hours. No results for input(s): AMMONIA in the last 168 hours. Coagulation  Profile:  Recent Labs Lab 02/17/17 1453  INR 1.24   Cardiac Enzymes: No results for input(s): CKTOTAL, CKMB, CKMBINDEX, TROPONINI in the last 168 hours. BNP (last 3 results) No results for input(s): PROBNP in the last 8760 hours. HbA1C: No results for input(s): HGBA1C in the last 72 hours. CBG: No results for input(s): GLUCAP in the last 168 hours. Lipid Profile: No results for input(s): CHOL, HDL, LDLCALC, TRIG, CHOLHDL, LDLDIRECT in the last 72 hours. Thyroid Function Tests: No results for input(s): TSH, T4TOTAL, FREET4, T3FREE, THYROIDAB in the last 72 hours. Anemia Panel: No results for input(s): VITAMINB12, FOLATE, FERRITIN, TIBC, IRON, RETICCTPCT in the last 72 hours. Urine analysis:    Component Value Date/Time   COLORURINE YELLOW 02/17/2017 2009   APPEARANCEUR HAZY (A) 02/17/2017 2009   LABSPEC 1.019 02/17/2017 2009   PHURINE 5.0 02/17/2017 2009   GLUCOSEU NEGATIVE 02/17/2017 2009   HGBUR NEGATIVE 02/17/2017 2009   [place] NEGATIVE 02/17/2017 2009   [place] NEGATIVE 02/17/2017 2009   PROTEINUR 30 (A) 02/17/2017 2009   NITRITE NEGATIVE 02/17/2017 2009   LEUKOCYTESUR MODERATE (A) 02/17/2017 2009   Sepsis Labs: @LABRCNTIP (procalcitonin:4,lacticidven:4) )No results found for this or any previous visit (from the past 240 hour(s)).   Radiological Exams on Admission: No results found.  EKG: Not performed, will obtain as appropriate.   Assessment/Plan  1. UTI  - Afebrile initially, later spiking temp of 40.8 C - There is leukocytosis, AKI, and soft initial BP; lactic acid reassuring at 1.19 - Blood and urine cultures collected in ED  - Treated with 2 liters NS and Rocephin in ED  - Continue empiric treatment with cefepime while awaiting culture data and following clinical response to treatment   2. Acute kidney injury  - SCr is 1.52 on admission, up from 0.87 last month  - Pt appears hypovolemic on admission with soft initial BP and this is likely a  prerenal azotemia, possibly worsened by continued lisinopril use  - He was given 2 liters NS in ED and is continue on NS infusion  - Hold lisinopril, avoid nephrotoxins, repeat chem panel in am   3. Postoperative anemia  - Hgb is 8.6 on admission, down from 12.5 on 01/28/17 prior to his back surgery  - No evidence for active blood-loss; no tachycardia or pallor  - Per pt's orthopedic surgeon, this degree of drop is expected for the surgery he underwent  - Monitor, type and screen, repeat CBC in am with dilutional drop in H&H expected    4. Hypertension  - BP was low initially here, stable since  - Managed with lisinopril at home; currently held as above given the  AKI    5. Lumbar radiculopathy  - Stable  - Status-post recent lumbar fusion and was evaluated by surgeon just prior to arrival in ED  - Continue prn Valium and Percocet     DVT prophylaxis: sq heparin  Code Status: Full  Family Communication: Wife and daughter updated at bedside Disposition Plan: Observe on telemetry Consults called: None Admission status: Observation    Vianne Bulls, MD [facility] Pager [phone]  If 7PM-7AM, please contact night-coverage [email or web address] Password [facility]  02/17/2017, 10:44 PM

## 2017-02-17 NOTE — ED Provider Notes (Signed)
[place] DEPT Provider Note   CSN: [id number] Arrival date & time: 02/17/17  1625     History   Chief Complaint Chief Complaint  Patient presents with  . Fever  . Hypotension    HPI Frank [name] is a 71 y.o. male.  The history is provided by the patient, a relative and medical records.  Fever   This is a new problem. The maximum temperature noted was 102 to 102.9 F. The temperature was taken using an oral thermometer. Pertinent negatives include no chest pain, no diarrhea, no vomiting, no congestion and no cough. He has tried nothing for the symptoms.    71 y.o. male PMH significant for lumbar posterior spinal fusion 3/21 presents for fever and malaise. Pt reports three days malaise and poor appetite. Febrile 102 at home this morning. Physical therapist noted decreased energy levels and sent to Ortho office this morning. Pt reports b/l upper thigh pain which was present before surgery but resolved for a week after surgery. No N/V/D. No anti-pyretics taken PTA.   Past Medical History:  Diagnosis Date  . Arthritis    left knee  . Cancer [facility])    colon cancer  . History of kidney stones   . Hypercholesteremia   . Hypertension   . Sleep apnea    uses cpap    Patient Active Problem List   Diagnosis Date Noted  . Acute lower UTI 02/17/2017  . AKI (acute kidney injury) ([place]) 02/17/2017  . Hypotension (arterial) 02/17/2017  . Postoperative anemia 02/17/2017  . Essential hypertension 02/17/2017  . UTI (urinary tract infection) 02/17/2017  . Lumbar radiculopathy 02/04/2017  . Muscle weakness (generalized) 02/17/2012    Past Surgical History:  Procedure Laterality Date  . ABDOMINAL EXPOSURE N/A 02/04/2017   Procedure: ABDOMINAL EXPOSURE;  Surgeon: Rosetta Posner, MD;  Location: [place];  Service: Vascular;  Laterality: N/A;  . ANTERIOR LAT LUMBAR FUSION Right 02/04/2017   Procedure: RIGHT SIDED LUMBAR 3-4, LUMBAR 4-5 LATERAL INTERBODY FUSION WITH INSTRUMENTATION AND  ALLOGRAFT;  Surgeon: Phylliss Bob, MD;  Location: [place];  Service: Orthopedics;  Laterality: Right;  LEFT OR RIGHT SIDED LUMBAR 2-3, LUMBAR 3-4 LATERAL INTERBODY FUSION WITH INSTRUMENTATION AND ALLOGRAFT  . ANTERIOR LUMBAR FUSION N/A 02/04/2017   Procedure: LUMBAR 5-SACRUM 1 ANTERIOR LUMBAR FUSION WITH INSTRUMENTATION AND ALLOGRAFT;  Surgeon: Phylliss Bob, MD;  Location: [place];  Service: Orthopedics;  Laterality: N/A;  LUMBAR 4-5, LUMBAR 5-SACRUM 1 ANTERIOR LUMBAR FUSION WITH INSTRUMENTATION AND ALLOGRAFT  . COLON SURGERY     hx colon ca  . COLONOSCOPY N/A 04/24/2014   Procedure: COLONOSCOPY;  Surgeon: Daneil Dolin, MD;  [facility];  Service: Endoscopy;  Laterality: N/A;  8:45 AM  . HERNIA REPAIR     bilateral  . POSTERIOR LUMBAR FUSION 4 LEVEL Bilateral 02/05/2017   Procedure: LUMBAR 2-3, LUMBAR 3-4, LUMBAR 4-5, LUMBAR 5-SACRUM 1 POSTERIOR SPINAL FUSION WITH INSTRUMENTATION AND ALLOGRAFT;  Surgeon: Phylliss Bob, MD;  Location: [place];  Service: Orthopedics;  Laterality: Bilateral;  LUMBAR 2-3, LUMBAR 3-4, LUMBAR 4-5, LUMBAR 5-SACRUM 1 POSTERIOR SPINAL FUSION WITH INSTRUMENTATION AND ALLOGRAFT       Home Medications    Prior to Admission medications   Medication Sig Start Date End Date Taking? Authorizing Provider  atorvastatin (LIPITOR) 40 MG tablet Take 40 mg by mouth at bedtime.   Yes Historical Provider, MD  cyanocobalamin 500 MCG tablet Take 500 mcg by mouth daily.   Yes Historical Provider, MD  diazepam (VALIUM) 5 MG tablet Take 5 mg by mouth every 6 (six) hours as needed for muscle spasms.  02/07/17  Yes Historical Provider, MD  lisinopril (PRINIVIL,ZESTRIL) 20 MG tablet Take 20 mg by mouth daily.   Yes Historical Provider, MD  Multiple Vitamins-Iron (ONE DAILY MULTIVITAMIN/IRON) TABS Take 1 tablet by mouth daily with breakfast.   Yes Historical Provider, MD  oxyCODONE-acetaminophen (PERCOCET/ROXICET) 5-325 MG tablet Take 1-2 tablets by mouth every 6 (six) hours as  needed. Patient taking differently: Take 1-2 tablets by mouth every 6 (six) hours as needed (for pain).  12/29/15  Yes Dorie Rank, MD  vitamin C (ASCORBIC ACID) 500 MG tablet Take 500 mg by mouth daily.   Yes Historical Provider, MD    Family History History reviewed. No pertinent family history.  Social History Social History  Substance Use Topics  . Smoking status: Never Smoker  . Smokeless tobacco: Never Used  . Alcohol use No     Allergies   No known allergies   Review of Systems Review of Systems  Constitutional: Positive for activity change, appetite change, fatigue and fever. Negative for chills and diaphoresis.  HENT: Negative for congestion.   Respiratory: Negative for cough and shortness of breath.   Cardiovascular: Negative for chest pain and leg swelling.  Gastrointestinal: Negative for abdominal pain, diarrhea, nausea and vomiting.  Genitourinary: Negative for difficulty urinating, dysuria, flank pain and hematuria.  Musculoskeletal: Negative for back pain, neck pain and neck stiffness.  Neurological: Negative for light-headedness.     Physical Exam Updated Vital Signs BP (!) 121/55 (BP Location: Left Arm)   Pulse (!) 119   Temp (!) 103.5 F (39.7 C)   Resp 18   Ht 6\' 2"  (1.88 m)   Wt 85.2 kg   SpO2 96%   BMI 24.11 kg/m   Physical Exam  Constitutional: He appears well-developed and well-nourished.  HENT:  Head: Normocephalic and atraumatic.  Eyes: Conjunctivae are normal.  Neck: Neck supple.  Cardiovascular: Normal rate and regular rhythm.   No murmur heard. Pulmonary/Chest: Effort normal and breath sounds normal. No respiratory distress.  Abdominal: Soft. There is no tenderness.  Musculoskeletal: Normal range of motion. He exhibits no edema.  Well healing incision wounds to lumbar spine, no swelling or drainage  Neurological: He is alert. GCS eye subscore is 4. GCS verbal subscore is 5. GCS motor subscore is 6.  5/5 LE strength  Skin: Skin is  warm and dry. Capillary refill takes less than 2 seconds.  Psychiatric: He has a normal mood and affect.  Nursing note and vitals reviewed.    ED Treatments / Results  Labs (all labs ordered are listed, but only abnormal results are displayed) Labs Reviewed  COMPREHENSIVE METABOLIC PANEL - Abnormal; Notable for the following:       Result Value   Glucose, Bld 126 (*)    Creatinine, Ser 1.52 (*)    Calcium 8.8 (*)    Albumin 3.2 (*)    Alkaline Phosphatase 129 (*)    Total Bilirubin 1.4 (*)    GFR calc non Af Amer 45 (*)    GFR calc Af Amer 52 (*)    All other components within normal limits  CBC WITH DIFFERENTIAL/PLATELET - Abnormal; Notable for the following:    WBC 17.8 (*)    RBC 3.05 (*)    Hemoglobin 8.6 (*)    HCT 27.4 (*)    Platelets 444 (*)    Neutro Abs 15.3 (*)  Monocytes Absolute 1.4 (*)    All other components within normal limits  PROTIME-INR - Abnormal; Notable for the following:    Prothrombin Time 15.6 (*)    All other components within normal limits  URINALYSIS, ROUTINE W REFLEX MICROSCOPIC - Abnormal; Notable for the following:    APPearance HAZY (*)    Protein, ur 30 (*)    Leukocytes, UA MODERATE (*)    Bacteria, UA MANY (*)    Squamous Epithelial / LPF 0-5 (*)    All other components within normal limits  CULTURE, BLOOD (ROUTINE X 2)  CULTURE, BLOOD (ROUTINE X 2)  URINE CULTURE  CBC WITH DIFFERENTIAL/PLATELET  COMPREHENSIVE METABOLIC PANEL  I-STAT CG4 LACTIC ACID, ED  I-STAT CG4 LACTIC ACID, ED    EKG  EKG Interpretation None       Radiology No results found.  Procedures Procedures (including critical care time)  Medications Ordered in ED Medications  diazepam (VALIUM) tablet 5 mg (5 mg Oral Given 02/18/17 0052)  cyanocobalamin tablet 500 mcg (not administered)  oxyCODONE-acetaminophen (PERCOCET/ROXICET) 5-325 MG per tablet 1-2 tablet (2 tablets Oral Given 02/18/17 0052)  atorvastatin (LIPITOR) tablet 40 mg (not administered)   sodium chloride flush (NS) 0.9 % injection 3 mL (3 mLs Intravenous Not Given 02/18/17 0025)  0.9 %  sodium chloride infusion ( Intravenous New Bag/Given 02/18/17 0025)  acetaminophen (TYLENOL) tablet 650 mg (not administered)    Or  acetaminophen (TYLENOL) suppository 650 mg (not administered)  ondansetron (ZOFRAN) tablet 4 mg (not administered)    Or  ondansetron (ZOFRAN) injection 4 mg (not administered)  ceFEPIme (MAXIPIME) 1 g in dextrose 5 % 50 mL IVPB (not administered)  multivitamin with minerals tablet 1 tablet (not administered)  sodium chloride 0.9 % bolus 1,000 mL (0 mLs Intravenous Stopped 02/17/17 1939)  sodium chloride 0.9 % bolus 1,000 mL (0 mLs Intravenous Stopped 02/17/17 2214)  cefTRIAXone (ROCEPHIN) 1 g in dextrose 5 % 50 mL IVPB (0 g Intravenous Stopped 02/17/17 2300)  acetaminophen (TYLENOL) tablet 1,000 mg (1,000 mg Oral Given 02/17/17 2256)  sodium chloride 0.9 % bolus 500 mL (500 mLs Intravenous New Bag/Given 02/17/17 2259)  ceFEPIme (MAXIPIME) 2 g in dextrose 5 % 50 mL IVPB (2 g Intravenous New Bag/Given 02/17/17 2303)     Initial Impression / Assessment and Plan / ED Course  I have reviewed the triage vital signs and the nursing notes.  Pertinent labs & imaging results that were available during my care of the patient were reviewed by me and considered in my medical decision making (see chart for details).    71 y.o. male two weeks post-op for spinal fusion presents from Ortho office for hypotension and fever. On arrival, AF, initial BP 87/54, however repeat 125/66 w/o intervention. No tachycardia. Pt well hydrated and well appearing. Discussed with Ortho office, plain films were obtained in office which were unremarkable. Pt was sent for IV hydration and labs. Do not think advanced imaging is indicated. Full LE strength. No saddle anesthesia, doubt epidural abscess or spinal cord compression. - no lactic acidosis. Hb 8.6 from pre-op12.5. Pt denies melena or hematochezia.  -  Elevated Cr 1.52 from 0.87. 2L NS given - UA concerning for UTI. IV Rocephin given.  While in ED pt became tachycardic, with oral temp 100.1. Remained normotensive. Will admit for IV antibiotics. Care transferred to hospitalist for admission.    Final Clinical Impressions(s) / ED Diagnoses   Final diagnoses:  Acute pyelonephritis    New Prescriptions  Current Discharge Medication List       Monico Blitz, MD 02/18/17 [place], DO 02/18/17 1530

## 2017-02-17 NOTE — Progress Notes (Signed)
Pharmacy Antibiotic Note  KARMAN VENEY is a 71 y.o. male admitted on 02/17/2017 with UTI.  Pharmacy has been consulted for cefepime dosing.  Plan: Cefepime 1 gram every 8 hours  Follow renal function, cultures and LOT plans Narrow as appropriate  Height: 6\' 2"  (188 cm) Weight: 180 lb (81.6 kg) IBW/kg (Calculated) : 82.2  Temp (24hrs), Avg:100 F (37.8 C), Min:98.1 F (36.7 C), Max:104.3 F (40.2 C)   Recent Labs Lab 02/17/17 1453 02/17/17 1702 02/17/17 2204  WBC 17.8*  --   --   CREATININE 1.52*  --   --   LATICACIDVEN  --  1.19 0.97    Estimated Creatinine Clearance: 52.2 mL/min (A) (by C-G formula based on SCr of 1.52 mg/dL (H)).    Allergies  Allergen Reactions  . No Known Allergies     Antimicrobials this admission: 4/3 cefepime >>   Dose adjustments this admission:   Microbiology results: 4/3 BCx:   Thank you for allowing pharmacy to be a part of this patient's care.  Melburn Popper 02/17/2017 10:47 PM

## 2017-02-18 ENCOUNTER — Observation Stay (HOSPITAL_COMMUNITY): Payer: Medicare Other

## 2017-02-18 ENCOUNTER — Encounter (HOSPITAL_COMMUNITY): Payer: Self-pay | Admitting: Family Medicine

## 2017-02-18 DIAGNOSIS — M5416 Radiculopathy, lumbar region: Secondary | ICD-10-CM | POA: Diagnosis present

## 2017-02-18 DIAGNOSIS — G4730 Sleep apnea, unspecified: Secondary | ICD-10-CM | POA: Diagnosis present

## 2017-02-18 DIAGNOSIS — G4733 Obstructive sleep apnea (adult) (pediatric): Secondary | ICD-10-CM | POA: Diagnosis present

## 2017-02-18 DIAGNOSIS — Z85038 Personal history of other malignant neoplasm of large intestine: Secondary | ICD-10-CM | POA: Diagnosis not present

## 2017-02-18 DIAGNOSIS — K5903 Drug induced constipation: Secondary | ICD-10-CM | POA: Diagnosis not present

## 2017-02-18 DIAGNOSIS — D649 Anemia, unspecified: Secondary | ICD-10-CM | POA: Diagnosis present

## 2017-02-18 DIAGNOSIS — R7989 Other specified abnormal findings of blood chemistry: Secondary | ICD-10-CM | POA: Diagnosis present

## 2017-02-18 DIAGNOSIS — I10 Essential (primary) hypertension: Secondary | ICD-10-CM

## 2017-02-18 DIAGNOSIS — K5900 Constipation, unspecified: Secondary | ICD-10-CM | POA: Diagnosis present

## 2017-02-18 DIAGNOSIS — Z9889 Other specified postprocedural states: Secondary | ICD-10-CM | POA: Diagnosis not present

## 2017-02-18 DIAGNOSIS — R6520 Severe sepsis without septic shock: Secondary | ICD-10-CM | POA: Diagnosis present

## 2017-02-18 DIAGNOSIS — A4151 Sepsis due to Escherichia coli [E. coli]: Secondary | ICD-10-CM | POA: Diagnosis present

## 2017-02-18 DIAGNOSIS — N390 Urinary tract infection, site not specified: Secondary | ICD-10-CM | POA: Diagnosis not present

## 2017-02-18 DIAGNOSIS — N10 Acute tubulo-interstitial nephritis: Secondary | ICD-10-CM | POA: Diagnosis present

## 2017-02-18 DIAGNOSIS — N179 Acute kidney failure, unspecified: Secondary | ICD-10-CM | POA: Diagnosis present

## 2017-02-18 DIAGNOSIS — R05 Cough: Secondary | ICD-10-CM | POA: Diagnosis not present

## 2017-02-18 DIAGNOSIS — Z981 Arthrodesis status: Secondary | ICD-10-CM | POA: Diagnosis not present

## 2017-02-18 DIAGNOSIS — Z87442 Personal history of urinary calculi: Secondary | ICD-10-CM | POA: Diagnosis not present

## 2017-02-18 DIAGNOSIS — B9620 Unspecified Escherichia coli [E. coli] as the cause of diseases classified elsewhere: Secondary | ICD-10-CM | POA: Diagnosis present

## 2017-02-18 DIAGNOSIS — I959 Hypotension, unspecified: Secondary | ICD-10-CM | POA: Diagnosis present

## 2017-02-18 DIAGNOSIS — Z79899 Other long term (current) drug therapy: Secondary | ICD-10-CM | POA: Diagnosis not present

## 2017-02-18 DIAGNOSIS — E861 Hypovolemia: Secondary | ICD-10-CM | POA: Diagnosis present

## 2017-02-18 DIAGNOSIS — R140 Abdominal distension (gaseous): Secondary | ICD-10-CM | POA: Diagnosis not present

## 2017-02-18 LAB — BLOOD CULTURE ID PANEL (REFLEXED)

## 2017-02-18 LAB — CBC WITH DIFFERENTIAL/PLATELET
Basophils Absolute: 0 K/uL (ref 0.0–0.1)
Basophils Relative: 0 %
Eosinophils Absolute: 0 K/uL (ref 0.0–0.7)
Eosinophils Relative: 0 %
HCT: 23.4 % — ABNORMAL LOW (ref 39.0–52.0)
Hemoglobin: 7.4 g/dL — ABNORMAL LOW (ref 13.0–17.0)
Lymphocytes Relative: 4 %
Lymphs Abs: 0.6 K/uL — ABNORMAL LOW (ref 0.7–4.0)
MCH: 28 pg (ref 26.0–34.0)
MCHC: 31.6 g/dL (ref 30.0–36.0)
MCV: 88.6 fL (ref 78.0–100.0)
Monocytes Absolute: 1.2 K/uL — ABNORMAL HIGH (ref 0.1–1.0)
Monocytes Relative: 7 %
Neutro Abs: 16 K/uL — ABNORMAL HIGH (ref 1.7–7.7)
Neutrophils Relative %: 89 %
Platelets: 372 K/uL (ref 150–400)
RBC: 2.64 MIL/uL — ABNORMAL LOW (ref 4.22–5.81)
RDW: 13.8 % (ref 11.5–15.5)
WBC: 17.8 K/uL — ABNORMAL HIGH (ref 4.0–10.5)

## 2017-02-18 LAB — COMPREHENSIVE METABOLIC PANEL WITH GFR
ALT: 19 U/L (ref 17–63)
AST: 20 U/L (ref 15–41)
Albumin: 2.7 g/dL — ABNORMAL LOW (ref 3.5–5.0)
Alkaline Phosphatase: 110 U/L (ref 38–126)
Anion gap: 10 (ref 5–15)
BUN: 19 mg/dL (ref 6–20)
CO2: 20 mmol/L — ABNORMAL LOW (ref 22–32)
Calcium: 7.9 mg/dL — ABNORMAL LOW (ref 8.9–10.3)
Chloride: 106 mmol/L (ref 101–111)
Creatinine, Ser: 1.43 mg/dL — ABNORMAL HIGH (ref 0.61–1.24)
GFR calc Af Amer: 56 mL/min — ABNORMAL LOW (ref >60)
GFR calc non Af Amer: 48 mL/min — ABNORMAL LOW (ref >60)
Glucose, Bld: 160 mg/dL — ABNORMAL HIGH (ref 65–99)
Potassium: 3.8 mmol/L (ref 3.5–5.1)
Sodium: 136 mmol/L (ref 135–145)
Total Bilirubin: 1.2 mg/dL (ref 0.3–1.2)
Total Protein: 5.9 g/dL — ABNORMAL LOW (ref 6.5–8.1)

## 2017-02-18 LAB — GLUCOSE, CAPILLARY: Glucose-Capillary: 124 mg/dL — ABNORMAL HIGH (ref 65–99)

## 2017-02-18 LAB — PREPARE RBC (CROSSMATCH)

## 2017-02-18 LAB — HEMOGLOBIN AND HEMATOCRIT, BLOOD
HCT: 26.2 % — ABNORMAL LOW (ref 39.0–52.0)
Hemoglobin: 8.6 g/dL — ABNORMAL LOW (ref 13.0–17.0)

## 2017-02-18 MED ORDER — SODIUM CHLORIDE 0.9 % IV SOLN
Freq: Once | INTRAVENOUS | Status: AC
  Administered 2017-02-18: 250 mL via INTRAVENOUS

## 2017-02-18 MED ORDER — DEXTROSE 5 % IV SOLN
2.0000 g | INTRAVENOUS | Status: DC
  Administered 2017-02-18 – 2017-02-19 (×2): 2 g via INTRAVENOUS
  Filled 2017-02-18 (×3): qty 2

## 2017-02-18 MED ORDER — CALCIUM CARBONATE ANTACID 500 MG PO CHEW
1.0000 | CHEWABLE_TABLET | Freq: Every day | ORAL | Status: DC | PRN
  Administered 2017-02-18 – 2017-02-19 (×2): 200 mg via ORAL
  Filled 2017-02-18 (×3): qty 1

## 2017-02-18 MED ORDER — SODIUM CHLORIDE 0.45 % IV SOLN
INTRAVENOUS | Status: DC
  Administered 2017-02-18: 1000 mL via INTRAVENOUS
  Administered 2017-02-19: 02:00:00 via INTRAVENOUS

## 2017-02-18 MED ORDER — BENZONATATE 100 MG PO CAPS
200.0000 mg | ORAL_CAPSULE | Freq: Two times a day (BID) | ORAL | Status: DC | PRN
  Administered 2017-02-18 – 2017-02-19 (×2): 200 mg via ORAL
  Filled 2017-02-18 (×3): qty 2

## 2017-02-18 MED ORDER — ADULT MULTIVITAMIN W/MINERALS CH
1.0000 | ORAL_TABLET | Freq: Every day | ORAL | Status: DC
  Administered 2017-02-18 – 2017-02-20 (×3): 1 via ORAL
  Filled 2017-02-18 (×3): qty 1

## 2017-02-18 MED ORDER — GUAIFENESIN-DM 100-10 MG/5ML PO SYRP
5.0000 mL | ORAL_SOLUTION | ORAL | Status: DC | PRN
  Administered 2017-02-18: 5 mL via ORAL
  Filled 2017-02-18: qty 5

## 2017-02-18 MED ORDER — ALUM & MAG HYDROXIDE-SIMETH 200-200-20 MG/5ML PO SUSP
15.0000 mL | Freq: Four times a day (QID) | ORAL | Status: DC | PRN
  Administered 2017-02-19: 15 mL via ORAL
  Filled 2017-02-18: qty 30

## 2017-02-18 NOTE — Progress Notes (Addendum)
PROGRESS NOTE    ELBERT SPICKLER  EVO:[id number] DOB: 1946-06-08 DOA: 02/17/2017 PCP: Wende Neighbors, MD   Brief Narrative: JAHMIER WILLADSEN is a 71 y.o. male with medical history significant for obstructive sleep apnea using CPAP, hypertension, hyperlipidemia, and lumbar radiculopathy status post recent lumbar fusion surgery. He presented with fevers, chills and lethargy with concern for UTI and found to have E. Coli bacteremia.   Assessment & Plan:   Principal Problem:   Acute lower UTI Active Problems:   Lumbar radiculopathy   AKI (acute kidney injury) ([place])   Hypotension (arterial)   Postoperative anemia   Essential hypertension   UTI (urinary tract infection)   E coli bacteremia Likely secondary to UTI. Febrile overnight. -transition to ceftriaxone -blood cultures pending for   Urinary tract infection -management above -urine culture pending  Acute kidney injury Baseline of 0.87. Up to 1.52 on admission. Secondary to hypovolemia. Improving. -IVF  Anemia Post-op. Acute drop is likely dilutional, however, patient is symptomatic. -blood transfusion x2 units -post-transfusion H&H -repeat CBC in AM -PT eval in AM  Essential hypertension Stable -continue to hold lisinopril  Lumbar radiculopathy S/p lumbar fusion. Stable -Continue Valium -Continue Percocet   DVT prophylaxis: SCDs Code Status: Full code Family Communication: Wife and daughter at bedside Disposition Plan: Discharge home in 1-2 days   Consultants:   None  Procedures:   None  Antimicrobials:  Cefepime (4/3>>4/4)  Ceftriaxone (4/4>>   Subjective: Patient is fatigued, complains of dyspnea and lightheadedness. No chest pain.  Objective: Vitals:   02/18/17 1242 02/18/17 1312 02/18/17 1537 02/18/17 1553  BP: 132/71 137/78 134/71 (!) 141/68  Pulse: 91 88 86 92  Resp: 18 18 18 20   Temp: 98.8 F (37.1 C) 98.5 F (36.9 C) 98.6 F (37 C) 98.3 F (36.8 C)  TempSrc: Axillary Axillary  Axillary Oral  SpO2: 96% 97% 97% 99%  Weight:      Height:        Intake/Output Summary (Last 24 hours) at 02/18/17 1608 Last data filed at 02/18/17 1602  Gross per 24 hour  Intake          2282.83 ml  Output              100 ml  Net          2182.83 ml   Filed Weights   02/17/17 1635 02/18/17 0019 02/18/17 0727  Weight: 81.6 kg (180 lb) 85.2 kg (187 lb 12.8 oz) 89.1 kg (196 lb 6.4 oz)    Examination:  General exam: Appears calm and comfortable Respiratory system: Clear to auscultation. Respiratory effort normal. Cardiovascular system: S1 & S2 heard, RRR. No murmurs, rubs, gallops or clicks. Gastrointestinal system: Abdomen is distended, soft and tender over incision area. Normal bowel sounds heard. Central nervous system: Alert and oriented. No focal neurological deficits. Extremities: No edema. No calf tenderness Skin: No cyanosis. No rashes Psychiatry: Judgement and insight appear normal. Mood & affect appropriate.     Data Reviewed: I have personally reviewed following labs and imaging studies  CBC:  Recent Labs Lab 02/17/17 1453 02/18/17 0159  WBC 17.8* 17.8*  NEUTROABS 15.3* 16.0*  HGB 8.6* 7.4*  HCT 27.4* 23.4*  MCV 89.8 88.6  PLT 444* 299   Basic Metabolic Panel:  Recent Labs Lab 02/17/17 1453 02/18/17 0159  NA 136 136  K 4.3 3.8  CL 102 106  CO2 23 20*  GLUCOSE 126* 160*  BUN 18 19  CREATININE 1.52* 1.43*  CALCIUM  8.8* 7.9*   GFR: Estimated Creatinine Clearance: 55.9 mL/min (A) (by C-G formula based on SCr of 1.43 mg/dL (H)). Liver Function Tests:  Recent Labs Lab 02/17/17 1453 02/18/17 0159  AST 30 20  ALT 23 19  ALKPHOS 129* 110  BILITOT 1.4* 1.2  PROT 7.1 5.9*  ALBUMIN 3.2* 2.7*   No results for input(s): LIPASE, AMYLASE in the last 168 hours. No results for input(s): AMMONIA in the last 168 hours. Coagulation Profile:  Recent Labs Lab 02/17/17 1453  INR 1.24   Cardiac Enzymes: No results for input(s): CKTOTAL, CKMB,  CKMBINDEX, TROPONINI in the last 168 hours. BNP (last 3 results) No results for input(s): PROBNP in the last 8760 hours. HbA1C: No results for input(s): HGBA1C in the last 72 hours. CBG:  Recent Labs Lab 02/18/17 0854  GLUCAP 124*   Lipid Profile: No results for input(s): CHOL, HDL, LDLCALC, TRIG, CHOLHDL, LDLDIRECT in the last 72 hours. Thyroid Function Tests: No results for input(s): TSH, T4TOTAL, FREET4, T3FREE, THYROIDAB in the last 72 hours. Anemia Panel: No results for input(s): VITAMINB12, FOLATE, FERRITIN, TIBC, IRON, RETICCTPCT in the last 72 hours. Sepsis Labs:  Recent Labs Lab 02/17/17 1702 02/17/17 2204  LATICACIDVEN 1.19 0.97    Recent Results (from the past 240 hour(s))  Culture, blood (Routine x 2)     Status: None (Preliminary result)   Collection Time: 02/17/17  4:53 PM  Result Value Ref Range Status   Specimen Description BLOOD RIGHT ANTECUBITAL  Final   Special Requests   Final    BOTTLES DRAWN AEROBIC AND ANAEROBIC Blood Culture adequate volume   Culture NO GROWTH < 24 HOURS  Final   Report Status PENDING  Incomplete  Culture, blood (Routine x 2)     Status: None (Preliminary result)   Collection Time: 02/17/17  5:56 PM  Result Value Ref Range Status   Specimen Description BLOOD RIGHT ANTECUBITAL  Final   Special Requests   Final    BOTTLES DRAWN AEROBIC AND ANAEROBIC Blood Culture adequate volume   Culture  Setup Time   Final    GRAM NEGATIVE RODS IN BOTH AEROBIC AND ANAEROBIC BOTTLES Organism ID to follow CRITICAL RESULT CALLED TO, READ BACK BY AND VERIFIED WITH: M TURNER 02/18/17 @ 0838 M VESTAL    Culture GRAM NEGATIVE RODS  Final   Report Status PENDING  Incomplete  Blood Culture ID Panel (Reflexed)     Status: Abnormal   Collection Time: 02/17/17  5:56 PM  Result Value Ref Range Status   Enterococcus species NOT DETECTED NOT DETECTED Final   Listeria monocytogenes NOT DETECTED NOT DETECTED Final   Staphylococcus species NOT DETECTED NOT  DETECTED Final   Staphylococcus aureus NOT DETECTED NOT DETECTED Final   Streptococcus species NOT DETECTED NOT DETECTED Final   Streptococcus agalactiae NOT DETECTED NOT DETECTED Final   Streptococcus pneumoniae NOT DETECTED NOT DETECTED Final   Streptococcus pyogenes NOT DETECTED NOT DETECTED Final   Acinetobacter baumannii NOT DETECTED NOT DETECTED Final   Enterobacteriaceae species DETECTED (A) NOT DETECTED Final    Comment: Enterobacteriaceae represent a large family of gram-negative bacteria, not a single organism. CRITICAL RESULT CALLED TO, READ BACK BY AND VERIFIED WITH: M TURNER 02/18/17 @ 0838 M VESTAL    Enterobacter cloacae complex NOT DETECTED NOT DETECTED Final   Escherichia coli DETECTED (A) NOT DETECTED Final    Comment: CRITICAL RESULT CALLED TO, READ BACK BY AND VERIFIED WITH: M TURNER 02/18/17 @ 5638 Addison Lank  Klebsiella oxytoca NOT DETECTED NOT DETECTED Final   Klebsiella pneumoniae NOT DETECTED NOT DETECTED Final   Proteus species NOT DETECTED NOT DETECTED Final   Serratia marcescens NOT DETECTED NOT DETECTED Final   Carbapenem resistance NOT DETECTED NOT DETECTED Final   Haemophilus influenzae NOT DETECTED NOT DETECTED Final   Neisseria meningitidis NOT DETECTED NOT DETECTED Final   Pseudomonas aeruginosa NOT DETECTED NOT DETECTED Final   Candida albicans NOT DETECTED NOT DETECTED Final   Candida glabrata NOT DETECTED NOT DETECTED Final   Candida krusei NOT DETECTED NOT DETECTED Final   Candida parapsilosis NOT DETECTED NOT DETECTED Final   Candida tropicalis NOT DETECTED NOT DETECTED Final         Radiology Studies: No results found.      Scheduled Meds: . atorvastatin  40 mg Oral QHS  . cefTRIAXone (ROCEPHIN)  IV  2 g Intravenous Q24H  . multivitamin with minerals  1 tablet Oral Q breakfast  . sodium chloride flush  3 mL Intravenous Q12H  . vitamin B-12  500 mcg Oral Daily   Continuous Infusions: . sodium chloride 1,000 mL (02/18/17 1141)      LOS: 0 days     Cordelia Poche, MD [facility] 02/18/2017, 4:08 PM Pager: [phone]  If 7PM-7AM, please contact night-coverage [email or web address] Password [email or web address] 02/18/2017, 4:08 PM

## 2017-02-18 NOTE — Progress Notes (Signed)
Patient arrived to the unit via bed from the emergency department.  Patient is alert and oriented x; disoriented to situation.  Patient has complaints of pain to his left leg 6/10.  Patient vitals: temp 103.5- Bp; 121/55, heart rate: 119 resp 18 and 96% on room air.   Patient arrived on the unit with brace to his back due to lmbar fusion surgery about 2 wks ago.  Patient has incisions to the abdomen left lower quadrant  and two on his back.  Edcucated the patient and family on how to reach the staff on the unit. Lowered the bed, activated the bed alarm, and placed the call light within reach

## 2017-02-18 NOTE — Progress Notes (Signed)
PHARMACY - PHYSICIAN COMMUNICATION CRITICAL VALUE ALERT - BLOOD CULTURE IDENTIFICATION (BCID)  Results for orders placed or performed during the hospital encounter of 02/17/17  Blood Culture ID Panel (Reflexed) (Collected: 02/17/2017  5:56 PM)  Result Value Ref Range   Enterococcus species NOT DETECTED NOT DETECTED   Listeria monocytogenes NOT DETECTED NOT DETECTED   Staphylococcus species NOT DETECTED NOT DETECTED   Staphylococcus aureus NOT DETECTED NOT DETECTED   Streptococcus species NOT DETECTED NOT DETECTED   Streptococcus agalactiae NOT DETECTED NOT DETECTED   Streptococcus pneumoniae NOT DETECTED NOT DETECTED   Streptococcus pyogenes NOT DETECTED NOT DETECTED   Acinetobacter baumannii NOT DETECTED NOT DETECTED   Enterobacteriaceae species DETECTED (A) NOT DETECTED   Enterobacter cloacae complex NOT DETECTED NOT DETECTED   Escherichia coli DETECTED (A) NOT DETECTED   Klebsiella oxytoca NOT DETECTED NOT DETECTED   Klebsiella pneumoniae NOT DETECTED NOT DETECTED   Proteus species NOT DETECTED NOT DETECTED   Serratia marcescens NOT DETECTED NOT DETECTED   Carbapenem resistance NOT DETECTED NOT DETECTED   Haemophilus influenzae NOT DETECTED NOT DETECTED   Neisseria meningitidis NOT DETECTED NOT DETECTED   Pseudomonas aeruginosa NOT DETECTED NOT DETECTED   Candida albicans NOT DETECTED NOT DETECTED   Candida glabrata NOT DETECTED NOT DETECTED   Candida krusei NOT DETECTED NOT DETECTED   Candida parapsilosis NOT DETECTED NOT DETECTED   Candida tropicalis NOT DETECTED NOT DETECTED   71 year old male admitted with UTI. He has 2 sets of blood cultures that are growing Gram negative rods.  E coli is detected on BCID.  He is currently being treated with Cefepime.   Name of physician (or Provider) Contacted: Dr. Lonny Prude  Changes to prescribed antibiotics required: Change Cefepime to Ceftriaxone 2g IV q24h  Norva Riffle 02/18/2017  10:18 AM

## 2017-02-19 ENCOUNTER — Inpatient Hospital Stay (HOSPITAL_COMMUNITY): Payer: Medicare Other

## 2017-02-19 DIAGNOSIS — R05 Cough: Secondary | ICD-10-CM

## 2017-02-19 DIAGNOSIS — R140 Abdominal distension (gaseous): Secondary | ICD-10-CM

## 2017-02-19 LAB — TYPE AND SCREEN
ABO/RH(D): B POS
Antibody Screen: NEGATIVE
Unit division: 0
Unit division: 0

## 2017-02-19 LAB — CBC
HCT: 23.8 % — ABNORMAL LOW (ref 39.0–52.0)
Hemoglobin: 7.8 g/dL — ABNORMAL LOW (ref 13.0–17.0)
MCH: 28.9 pg (ref 26.0–34.0)
MCHC: 32.8 g/dL (ref 30.0–36.0)
MCV: 88.1 fL (ref 78.0–100.0)
Platelets: 343 K/uL (ref 150–400)
RBC: 2.7 MIL/uL — ABNORMAL LOW (ref 4.22–5.81)
RDW: 14.4 % (ref 11.5–15.5)
WBC: 12.2 K/uL — ABNORMAL HIGH (ref 4.0–10.5)

## 2017-02-19 LAB — BASIC METABOLIC PANEL WITH GFR
Anion gap: 8 (ref 5–15)
BUN: 32 mg/dL — ABNORMAL HIGH (ref 6–20)
CO2: 22 mmol/L (ref 22–32)
Calcium: 7.7 mg/dL — ABNORMAL LOW (ref 8.9–10.3)
Chloride: 103 mmol/L (ref 101–111)
Creatinine, Ser: 1.07 mg/dL (ref 0.61–1.24)
GFR calc Af Amer: >60 mL/min (ref >60)
GFR calc non Af Amer: >60 mL/min (ref >60)
Glucose, Bld: 154 mg/dL — ABNORMAL HIGH (ref 65–99)
Potassium: 3.7 mmol/L (ref 3.5–5.1)
Sodium: 133 mmol/L — ABNORMAL LOW (ref 135–145)

## 2017-02-19 LAB — BPAM RBC
Blood Product Expiration Date: 201804262359
Blood Product Expiration Date: 201804262359
ISSUE DATE / TIME: 201804041246
ISSUE DATE / TIME: 201804041552
Unit Type and Rh: 7300
Unit Type and Rh: 7300

## 2017-02-19 LAB — GLUCOSE, CAPILLARY: Glucose-Capillary: 124 mg/dL — ABNORMAL HIGH (ref 65–99)

## 2017-02-19 MED ORDER — POLYETHYLENE GLYCOL 3350 17 G PO PACK
17.0000 g | PACK | Freq: Two times a day (BID) | ORAL | Status: DC
  Administered 2017-02-19 – 2017-02-20 (×3): 17 g via ORAL
  Filled 2017-02-19 (×3): qty 1

## 2017-02-19 MED ORDER — MINERAL OIL RE ENEM
1.0000 | ENEMA | Freq: Once | RECTAL | Status: AC
  Administered 2017-02-19: 1 via RECTAL
  Filled 2017-02-19: qty 1

## 2017-02-19 MED ORDER — CHLORPROMAZINE HCL 25 MG PO TABS
25.0000 mg | ORAL_TABLET | Freq: Three times a day (TID) | ORAL | Status: DC
Start: 2017-02-19 — End: 2017-02-19
  Filled 2017-02-19 (×2): qty 1

## 2017-02-19 NOTE — Progress Notes (Signed)
MD paged due to patient's complaints of having hiccups all night.

## 2017-02-19 NOTE — Progress Notes (Signed)
PROGRESS NOTE    Frank [name]  EHU:[id number] DOB: Dec 31, 1945 DOA: 02/17/2017 PCP: Wende Neighbors, MD   Brief Narrative: Frank [name] is a 71 y.o. male with medical history significant for obstructive sleep apnea using CPAP, hypertension, hyperlipidemia, and lumbar radiculopathy status post recent lumbar fusion surgery. He presented with fevers, chills and lethargy with concern for UTI and found to have E. Coli bacteremia.   Assessment & Plan:   Principal Problem:   Acute lower UTI Active Problems:   Lumbar radiculopathy   AKI (acute kidney injury) ([place])   Hypotension (arterial)   Postoperative anemia   Essential hypertension   UTI (urinary tract infection)   E coli bacteremia Likely secondary to UTI. Febrile overnight. -continue ceftriaxone -blood cultures pending  Urinary tract infection -management above -urine culture pending  Acute kidney injury Baseline of 0.87. Up to 1.52 on admission. Secondary to hypovolemia. Improved.  Anemia Post-op. S/p 2 units PRBC. Drop from yesterday. No evidence of bleeding. Up to date on colonoscopy screening. -repeat CBC in AM -PT eval  Essential hypertension Stable -continue to hold lisinopril  Lumbar radiculopathy S/p lumbar fusion. Stable -Continue Valium -Continue Percocet  Constipation Abdominal distension -KUB -mineral oil enema   DVT prophylaxis: SCDs Code Status: Full code Family Communication: Wife and daughter at bedside Disposition Plan: Discharge home in 1-2 days   Consultants:   None  Procedures:   None  Antimicrobials:  Cefepime (4/3>>4/4)  Ceftriaxone (4/4>>   Subjective: Improved from yesterday. Passing gas. Abdominal distension persists. No abdominal pain. Bowel movement last night.  Objective: Vitals:   02/18/17 2209 02/18/17 2313 02/19/17 0500 02/19/17 0533  BP: (!) 144/76   128/65  Pulse: 100 98  94  Resp: 18 18  18   Temp: (!) 100.9 F (38.3 C)   98.5 F (36.9 C)  TempSrc:     Oral  SpO2: 98% 98%  98%  Weight:   89.3 kg (196 lb 14.4 oz)   Height:        Intake/Output Summary (Last 24 hours) at 02/19/17 1323 Last data filed at 02/19/17 1116  Gross per 24 hour  Intake             2785 ml  Output              400 ml  Net             2385 ml   Filed Weights   02/18/17 0019 02/18/17 0727 02/19/17 0500  Weight: 85.2 kg (187 lb 12.8 oz) 89.1 kg (196 lb 6.4 oz) 89.3 kg (196 lb 14.4 oz)    Examination:  General exam: Appears calm and comfortable Respiratory system: Clear to auscultation. Respiratory effort normal. Cardiovascular system: S1 & S2 heard, RRR. No murmurs Gastrointestinal system: Abdomen is distended, soft and tender over incision area. Normal bowel sounds heard. Central nervous system: Alert and oriented. No focal neurological deficits. Extremities: No edema. No calf tenderness Skin: No cyanosis. No rashes Psychiatry: Judgement and insight appear normal. Mood & affect appropriate.     Data Reviewed: I have personally reviewed following labs and imaging studies  CBC:  Recent Labs Lab 02/17/17 1453 02/18/17 0159 02/18/17 2028 02/19/17 0520  WBC 17.8* 17.8*  --  12.2*  NEUTROABS 15.3* 16.0*  --   --   HGB 8.6* 7.4* 8.6* 7.8*  HCT 27.4* 23.4* 26.2* 23.8*  MCV 89.8 88.6  --  88.1  PLT 444* 372  --  785   Basic Metabolic Panel:  Recent Labs Lab 02/17/17 1453 02/18/17 0159 02/19/17 0520  NA 136 136 133*  K 4.3 3.8 3.7  CL 102 106 103  CO2 23 20* 22  GLUCOSE 126* 160* 154*  BUN 18 19 32*  CREATININE 1.52* 1.43* 1.07  CALCIUM 8.8* 7.9* 7.7*   GFR: Estimated Creatinine Clearance: 74.7 mL/min (by C-G formula based on SCr of 1.07 mg/dL). Liver Function Tests:  Recent Labs Lab 02/17/17 1453 02/18/17 0159  AST 30 20  ALT 23 19  ALKPHOS 129* 110  BILITOT 1.4* 1.2  PROT 7.1 5.9*  ALBUMIN 3.2* 2.7*   No results for input(s): LIPASE, AMYLASE in the last 168 hours. No results for input(s): AMMONIA in the last 168  hours. Coagulation Profile:  Recent Labs Lab 02/17/17 1453  INR 1.24   Cardiac Enzymes: No results for input(s): CKTOTAL, CKMB, CKMBINDEX, TROPONINI in the last 168 hours. BNP (last 3 results) No results for input(s): PROBNP in the last 8760 hours. HbA1C: No results for input(s): HGBA1C in the last 72 hours. CBG:  Recent Labs Lab 02/18/17 0854 02/19/17 0808  GLUCAP 124* 124*   Lipid Profile: No results for input(s): CHOL, HDL, LDLCALC, TRIG, CHOLHDL, LDLDIRECT in the last 72 hours. Thyroid Function Tests: No results for input(s): TSH, T4TOTAL, FREET4, T3FREE, THYROIDAB in the last 72 hours. Anemia Panel: No results for input(s): VITAMINB12, FOLATE, FERRITIN, TIBC, IRON, RETICCTPCT in the last 72 hours. Sepsis Labs:  Recent Labs Lab 02/17/17 1702 02/17/17 2204  LATICACIDVEN 1.19 0.97    Recent Results (from the past 240 hour(s))  Culture, blood (Routine x 2)     Status: None (Preliminary result)   Collection Time: 02/17/17  4:53 PM  Result Value Ref Range Status   Specimen Description BLOOD RIGHT ANTECUBITAL  Final   Special Requests   Final    BOTTLES DRAWN AEROBIC AND ANAEROBIC Blood Culture adequate volume   Culture NO GROWTH < 24 HOURS  Final   Report Status PENDING  Incomplete  Culture, blood (Routine x 2)     Status: Abnormal (Preliminary result)   Collection Time: 02/17/17  5:56 PM  Result Value Ref Range Status   Specimen Description BLOOD RIGHT ANTECUBITAL  Final   Special Requests   Final    BOTTLES DRAWN AEROBIC AND ANAEROBIC Blood Culture adequate volume   Culture  Setup Time   Final    GRAM NEGATIVE RODS IN BOTH AEROBIC AND ANAEROBIC BOTTLES CRITICAL RESULT CALLED TO, READ BACK BY AND VERIFIED WITH: M TURNER 02/18/17 @ 0838 M VESTAL    Culture ESCHERICHIA COLI SUSCEPTIBILITIES TO FOLLOW  (A)  Final   Report Status PENDING  Incomplete  Blood Culture ID Panel (Reflexed)     Status: Abnormal   Collection Time: 02/17/17  5:56 PM  Result Value Ref  Range Status   Enterococcus species NOT DETECTED NOT DETECTED Final   Listeria monocytogenes NOT DETECTED NOT DETECTED Final   Staphylococcus species NOT DETECTED NOT DETECTED Final   Staphylococcus aureus NOT DETECTED NOT DETECTED Final   Streptococcus species NOT DETECTED NOT DETECTED Final   Streptococcus agalactiae NOT DETECTED NOT DETECTED Final   Streptococcus pneumoniae NOT DETECTED NOT DETECTED Final   Streptococcus pyogenes NOT DETECTED NOT DETECTED Final   Acinetobacter baumannii NOT DETECTED NOT DETECTED Final   Enterobacteriaceae species DETECTED (A) NOT DETECTED Final    Comment: Enterobacteriaceae represent a large family of gram-negative bacteria, not a single organism. CRITICAL RESULT CALLED TO, READ BACK BY AND VERIFIED WITH: M TURNER  02/18/17 @ 0838 M VESTAL    Enterobacter cloacae complex NOT DETECTED NOT DETECTED Final   Escherichia coli DETECTED (A) NOT DETECTED Final    Comment: CRITICAL RESULT CALLED TO, READ BACK BY AND VERIFIED WITH: M TURNER 02/18/17 @ 0838 M VESTAL    Klebsiella oxytoca NOT DETECTED NOT DETECTED Final   Klebsiella pneumoniae NOT DETECTED NOT DETECTED Final   Proteus species NOT DETECTED NOT DETECTED Final   Serratia marcescens NOT DETECTED NOT DETECTED Final   Carbapenem resistance NOT DETECTED NOT DETECTED Final   Haemophilus influenzae NOT DETECTED NOT DETECTED Final   Neisseria meningitidis NOT DETECTED NOT DETECTED Final   Pseudomonas aeruginosa NOT DETECTED NOT DETECTED Final   Candida albicans NOT DETECTED NOT DETECTED Final   Candida glabrata NOT DETECTED NOT DETECTED Final   Candida krusei NOT DETECTED NOT DETECTED Final   Candida parapsilosis NOT DETECTED NOT DETECTED Final   Candida tropicalis NOT DETECTED NOT DETECTED Final  Culture, Urine     Status: Abnormal (Preliminary result)   Collection Time: 02/17/17  8:09 PM  Result Value Ref Range Status   Specimen Description URINE, RANDOM  Final   Special Requests NONE  Final    Culture >=100,000 COLONIES/mL GRAM NEGATIVE RODS (A)  Final   Report Status PENDING  Incomplete         Radiology Studies: Dg Chest Port 1 View  Result Date: 02/18/2017 CLINICAL DATA:  Cough EXAM: PORTABLE CHEST 1 VIEW COMPARISON:  02/04/2017 FINDINGS: Lower lung volumes than on prior exam. Slight increase in retrocardiac opacity may represent areas of atelectasis, trace layering effusion or pneumonia. No acute osseous abnormality. Heart is within normal limits. There is a stable aortic atherosclerosis. No acute nor suspicious osseous abnormality. IMPRESSION: Lower lung volumes than on prior exam. Retrocardiac density is noted with hazy appearance at the left lung base. Trace layering effusion, atelectasis or pneumonia may account for this appearance. Electronically Signed   By: Ashley Royalty M.D.   On: 02/18/2017 17:50   Dg Abd Portable 1v  Result Date: 02/19/2017 CLINICAL DATA:  Abdominal distension EXAM: PORTABLE ABDOMEN - 1 VIEW COMPARISON:  None. FINDINGS: Gas is distension of small bowel and colon. There is no bowel dilatation to suggest obstruction. There is no evidence of pneumoperitoneum, portal venous gas or pneumatosis. There are no pathologic calcifications along the expected course of the ureters. Posterior lumbar fusion from L2 through S1. IMPRESSION: Gaseous distension of small bowel colon which may be secondary to an ileus. Electronically Signed   By: Kathreen Devoid   On: 02/19/2017 11:19        Scheduled Meds: . atorvastatin  40 mg Oral QHS  . cefTRIAXone (ROCEPHIN)  IV  2 g Intravenous Q24H  . chlorproMAZINE  25 mg Oral TID  . mineral oil  1 enema Rectal Once  . multivitamin with minerals  1 tablet Oral Q breakfast  . polyethylene glycol  17 g Oral BID  . sodium chloride flush  3 mL Intravenous Q12H  . vitamin B-12  500 mcg Oral Daily   Continuous Infusions:    LOS: 1 day     Cordelia Poche, MD [facility] 02/19/2017, 1:23 PM Pager: [phone]  If  7PM-7AM, please contact night-coverage [email or web address] Password [email or web address] 02/19/2017, 1:23 PM

## 2017-02-19 NOTE — Progress Notes (Signed)
Initial Nutrition Assessment  DOCUMENTATION CODES:   Not applicable  INTERVENTION:   -Ensure Enlive po BID, each supplement provides 350 kcal and 20 grams of protein -MVI daily  NUTRITION DIAGNOSIS:   Inadequate oral intake related to poor appetite as evidenced by per patient/family report.  GOAL:   Patient will meet greater than or equal to 90% of their needs  MONITOR:   PO intake, Supplement acceptance, Labs, Weight trends, Skin, I & O's  REASON FOR ASSESSMENT:   Consult Assessment of nutrition requirement/status  ASSESSMENT:   Frank [name] is a 71 y.o. male with medical history significant for obstructive sleep apnea using CPAP, hypertension, hyperlipidemia, and lumbar radiculopathy status post recent lumbar fusion surgery who presents to the emergency department for evaluation of fevers, chills, and lethargy.   Pt admitted with UTI.   Pt drowsy at time of visit. Hx obtained from pt wife at bedside, who reports that pt recently underwent lumbar fusion sx approximately 2 weeks ago. Prior to surgery, she describes pt as a very active man with a very good appetite. Pt appetite has been good up until hospitalization. However, pt wife is concerned about pt's intake, as he typically does not eat well in the hospital; pt is a fairly selective eater and has been experiencing heartburn and belching and, as a result, has been avoiding a lot of foods that they believe may be aggravating these symptoms. Pt does not eat pork at baseline, however, typically get protein from milk, grilled chicken, nuts, and an occasional hamburger. He ate only a few bites of breakfast this AM per her report.   Pt wife denies wt loss. Noted UBW ranges from 180-190#.   Reviewed menu items available to patient that are more conduscent to his food preferences. Pt wife is very concerned about protein intake due to recent surgery. Reviewed food sources high in protein as well as available supplements. Pt  amenable to Ensure supplements.   Nutrition-Focused physical exam completed. Findings are no fat depletion, no muscle depletion, and no edema.   Labs reviewed: Na: 133, CBG: 124.   Diet Order:  Diet regular Room service appropriate? Yes; Fluid consistency: Thin  Skin:  Reviewed, no issues (multiple incisions)  Last BM:  02/18/17  Height:   Ht Readings from Last 1 Encounters:  02/18/17 6\' 2"  (1.88 m)    Weight:   Wt Readings from Last 1 Encounters:  02/19/17 196 lb 14.4 oz (89.3 kg)    Ideal Body Weight:  86.4 kg  BMI:  Body mass index is 25.28 kg/m.  Estimated Nutritional Needs:   Kcal:  2100-2300  Protein:  105-120 grams  Fluid:  2.1-2.3 L  EDUCATION NEEDS:   Education needs addressed  Milli Woolridge A. Jimmye Norman, RD, LDN, CDE Pager: [phone] After hours Pager: [phone]

## 2017-02-19 NOTE — Evaluation (Signed)
Physical Therapy Evaluation Patient Details Name: Frank [name] MRN: [id number] DOB: 29-Jan-1946 Today's Date: 02/19/2017   History of Present Illness  Pt is a 71 y/o male with UTI, recently discharged following posterior spinal fusion.  PMH significant for HTN.   Clinical Impression  Pt tolerated session well.  Demonstrates general deconditioning, but good mobility overall.  Recommend f/u with acute PT for advancing activity tolerance and continuing with HHPT at d/c.     Follow Up Recommendations Home health PT;Supervision for mobility/OOB    Equipment Recommendations  None recommended by PT    Recommendations for Other Services       Precautions / Restrictions Precautions Precautions: Back Required Braces or Orthoses: Spinal Brace Spinal Brace: Thoracolumbosacral orthotic;Applied in sitting position Restrictions Weight Bearing Restrictions: No      Mobility  Bed Mobility Overal bed mobility: Needs Assistance Bed Mobility: Rolling;Sidelying to Sit Rolling: Supervision Sidelying to sit: Supervision (with bedrails)          Transfers Overall transfer level: Needs assistance Equipment used: Rolling walker (2 wheeled) Transfers: Sit to/from Stand Sit to Stand: Min guard         General transfer comment: verbal cues for slowing descent when sitting  Ambulation/Gait Ambulation/Gait assistance: Supervision Ambulation Distance (Feet): 200 Feet Assistive device: Rolling walker (2 wheeled) Gait Pattern/deviations: Step-through pattern;Decreased stride length;Trunk flexed Gait velocity: decrease      Stairs            Wheelchair Mobility    Modified Rankin (Stroke Patients Only)       Balance                                             Pertinent Vitals/Pain Pain Assessment: Faces Faces Pain Scale: Hurts a little bit Pain Location: incisional sites  Pain Descriptors / Indicators: Sore Pain Intervention(s):  Repositioned;Monitored during session    Home Living Family/patient expects to be discharged to:: Private residence Living Arrangements: Spouse/significant other Available Help at Discharge: Family;Available 24 hours/day Type of Home: House Home Access: Stairs to enter Entrance Stairs-Rails: None Entrance Stairs-Number of Steps: 1 Home Layout: Able to live on main level with bedroom/bathroom;Two level;Full bath on main level;Laundry or work area in [place]: Environmental consultant - 2 wheels;Hand held shower head;Shower seat - built in      Prior Function Level of Independence: Needs assistance         Comments: supervision with RW following recent back surgery, wife home with pt during the day     Hand Dominance        Extremity/Trunk Assessment        Lower Extremity Assessment Lower Extremity Assessment: Generalized weakness (not formally assessed 2/2 recent spinal surgery)       Communication   Communication: No difficulties  Cognition Arousal/Alertness: Awake/alert Behavior During Therapy: WFL for tasks assessed/performed;Flat affect Overall Cognitive Status: Within Functional Limits for tasks assessed                                        General Comments      Exercises     Assessment/Plan    PT Assessment Patient needs continued PT services  PT Problem List Decreased strength;Decreased activity tolerance;Decreased mobility;Decreased knowledge of use of DME  PT Treatment Interventions DME instruction;Gait training;Therapeutic exercise;Balance training    PT Goals (Current goals can be found in the Care Plan section)  Acute Rehab PT Goals Patient Stated Goal: to get stronger PT Goal Formulation: With patient/family Time For Goal Achievement: 02/26/17 Potential to Achieve Goals: Good    Frequency Min 3X/week   Barriers to discharge Inaccessible home environment 1 step with no rail to enter    Co-evaluation                End of Session Equipment Utilized During Treatment: Back brace Activity Tolerance: Patient tolerated treatment well Patient left: in bed;with call bell/phone within reach;with family/visitor present (sitting EOB) Nurse Communication: Mobility status (family okay to supervision pt up in room) PT Visit Diagnosis: Difficulty in walking, not elsewhere classified (R26.2);Muscle weakness (generalized) (M62.81)    Time: 1400-1430 PT Time Calculation (min) (ACUTE ONLY): 30 min   Charges:   PT Evaluation $PT Eval Low Complexity: 1 Procedure PT Treatments $Gait Training: 8-22 mins   PT G Codes:        Dwyane Dee, PT, DPT   Ruba Outen E Penven-Crew 02/19/2017, 4:21 PM

## 2017-02-20 ENCOUNTER — Inpatient Hospital Stay (HOSPITAL_COMMUNITY): Payer: Medicare Other

## 2017-02-20 DIAGNOSIS — R066 Hiccough: Secondary | ICD-10-CM

## 2017-02-20 DIAGNOSIS — K5903 Drug induced constipation: Secondary | ICD-10-CM

## 2017-02-20 LAB — CULTURE, BLOOD (ROUTINE X 2): Special Requests: ADEQUATE

## 2017-02-20 LAB — CBC
HCT: 24.3 % — ABNORMAL LOW (ref 39.0–52.0)
Hemoglobin: 7.7 g/dL — ABNORMAL LOW (ref 13.0–17.0)
MCH: 27.9 pg (ref 26.0–34.0)
MCHC: 31.7 g/dL (ref 30.0–36.0)
MCV: 88 fL (ref 78.0–100.0)
Platelets: 375 K/uL (ref 150–400)
RBC: 2.76 MIL/uL — ABNORMAL LOW (ref 4.22–5.81)
RDW: 14.4 % (ref 11.5–15.5)
WBC: 11.9 K/uL — ABNORMAL HIGH (ref 4.0–10.5)

## 2017-02-20 LAB — GLUCOSE, CAPILLARY: Glucose-Capillary: 131 mg/dL — ABNORMAL HIGH (ref 65–99)

## 2017-02-20 LAB — URINE CULTURE: Culture: >=100000 — AB

## 2017-02-20 MED ORDER — AMOXICILLIN-POT CLAVULANATE 875-125 MG PO TABS
1.0000 | ORAL_TABLET | Freq: Two times a day (BID) | ORAL | Status: DC
  Administered 2017-02-20: 1 via ORAL
  Filled 2017-02-20: qty 1

## 2017-02-20 MED ORDER — ENSURE ENLIVE PO LIQD
237.0000 mL | Freq: Two times a day (BID) | ORAL | Status: DC
  Administered 2017-02-20 (×2): 237 mL via ORAL

## 2017-02-20 MED ORDER — AMOXICILLIN-POT CLAVULANATE 875-125 MG PO TABS: 1.0000 | ORAL_TABLET | Freq: Two times a day (BID) | ORAL | 0 refills | Status: AC

## 2017-02-20 MED ORDER — POLYETHYLENE GLYCOL 3350 17 G PO PACK: 17.0000 g | PACK | Freq: Two times a day (BID) | ORAL | 0 refills | Status: DC

## 2017-02-20 MED ORDER — ENSURE ENLIVE PO LIQD: 237.0000 mL | Freq: Two times a day (BID) | ORAL | 0 refills | Status: AC

## 2017-02-20 MED ORDER — BACLOFEN 10 MG PO TABS: 5.0000 mg | ORAL_TABLET | Freq: Three times a day (TID) | ORAL | Status: DC

## 2017-02-20 MED ORDER — BACLOFEN 10 MG PO TABS: 5.0000 mg | ORAL_TABLET | Freq: Three times a day (TID) | ORAL | 0 refills | Status: DC

## 2017-02-20 NOTE — Discharge Summary (Signed)
Physician Discharge Summary  Frank [name] FIE:[id number] DOB: 1946-06-10 DOA: 02/17/2017  PCP: Wende Neighbors, MD  Admit date: 02/17/2017 Discharge date: 02/20/2017  Admitted From: Home Disposition: Home  Recommendations for Outpatient Follow-up:  1. Follow up with PCP in 1 week 2. Please obtain CBC in one week to follow-up hemoglobin   Home Health: Physical therapy  Discharge Condition: Stable CODE STATUS: Full code   Brief/Interim Summary:  Admission HPI written by Vianne Bulls, MD   Chief Complaint: Fever, chills, fatigue   HPI: Frank [name] is a 71 y.o. male with medical history significant for obstructive sleep apnea using CPAP, hypertension, hyperlipidemia, and lumbar radiculopathy status post recent lumbar fusion surgery who presents to the emergency department for evaluation of fevers, chills, and lethargy. Patient is accompanied by his wife and daughter who assist with the history. He had reportedly been doing well since the surgery was performed on his lumbar spine approximately 2 weeks ago. Yesterday, his wife noted that he seemed to be more tired than usual and the patient developed chills last night. Today, patient was much more fatigued than usual and continued to have subjective fevers and chills. He went to his physical therapy appointment. Was found to be ill-appearing and directed to the orthopedic surgeon's office for evaluation of possible postoperative complication. Patient was evaluated in the orthopedic surgeon's clinic with radiographs of the chest and surgical sites which appeared nonacute. Incision sites appeared appropriate to the surgeon and the patient was directed to the ED for further evaluation of his complaints. Frank [name] denies any lower extremity pain or swelling, denies chest pain or palpitations, and denies dyspnea or cough. He also denies rhinorrhea or sore throat. He notes pain in the bilateral groin, but no dysuria or flank pain. He denies any sick  contacts.  ED Course: Upon arrival to the ED, patient is found to be afebrile initially, saturating well on room air, blood pressure of 87/54, and normal heart rate and respirations. Chemistry panels notable for a serum creatinine 1.52, up from 0.87 last month. CBC is notable for a leukocytosis to 17,800, normocytic anemia with hemoglobin of 8.6, and thrombocytosis with platelets 444,000. Lactic acid is reassuring at 1.19. Urinalysis is consistent with infection and sample was sent for culture. Chest x-ray was performed at the orthopedic surgeon's office and radiology indicates that it is a negative study. Blood cultures were obtained, 2 L of normal saline were administered, and the patient was started on empiric Rocephin. Blood pressure improved and remained stable in the ED and the patient has not been in any respiratory distress. ED physician discussed the drop in hemoglobin with the patient's surgeon who felt that it was an appropriate drop given the two 6-hour surgeries that the patient underwent. Patient will be observed on the medical/surgical unit for ongoing evaluation and management of UTI with acute kidney injury.    Hospital course:  E coli bacteremia Pyelonephritis Likely secondary to recent surgery and foley placement. Blood and urine culture significant for E. Coli that is pan-sensitive. Initially on ceftriaxone and transitioned to Augmentin on discharge. Two week total course of antibiotics  Acute kidney injury Baseline of 0.87. Up to 1.52 on admission. Secondary to hypovolemia. Improved with IV fluids.  Anemia Likely secondary to recent surgery. Two units of packed red blood cells given. Stable before discharge. No evidence of bleeding. Up to date on colonoscopy screens. PT recommended home health physical therapy  Essential hypertension Stable. Held lisinopril secondary to acute kidney  injury. Restart lisinopril on discharge. -continue to hold lisinopril  Lumbar  radiculopathy S/p lumbar fusion. Stable  Constipation Abdominal distension Bowel regimen started. Patient had bowel movement with improvement of distension. Abdominal x-ray significant for stool and no evidence of obstruction.  Hiccups Started on Baclofen. Hold robaxin while on Baclofen. Taper if no effect.  Discharge Diagnoses:  Principal Problem:   Acute lower UTI Active Problems:   Lumbar radiculopathy   AKI (acute kidney injury) (HCC)   Hypotension (arterial)   Postoperative anemia   Essential hypertension   UTI (urinary tract infection)    Discharge Instructions  Discharge Instructions    Call MD for:  persistant dizziness or light-headedness    Complete by:  As directed    Call MD for:  redness, tenderness, or signs of infection (pain, swelling, redness, odor or green/yellow discharge around incision site)    Complete by:  As directed    Call MD for:  severe uncontrolled pain    Complete by:  As directed    Call MD for:  temperature >100.4    Complete by:  As directed    Diet - low sodium heart healthy    Complete by:  As directed    Increase activity slowly    Complete by:  As directed      Allergies as of 02/20/2017      Reactions   No Known Allergies       Medication List    STOP taking these medications   cyanocobalamin 500 MCG tablet     TAKE these medications   amoxicillin-clavulanate 875-125 MG tablet Commonly known as:  AUGMENTIN Take 1 tablet by mouth every 12 (twelve) hours.   atorvastatin 40 MG tablet Commonly known as:  LIPITOR Take 40 mg by mouth at bedtime.   baclofen 10 MG tablet Commonly known as:  LIORESAL Take 0.5 tablets (5 mg total) by mouth 3 (three) times daily. Use for hiccups.   diazepam 5 MG tablet Commonly known as:  VALIUM Take 5 mg by mouth every 6 (six) hours as needed for muscle spasms.   feeding supplement (ENSURE ENLIVE) Liqd Take 237 mLs by mouth 2 (two) times daily between meals. Start taking on:  02/21/2017    lisinopril 20 MG tablet Commonly known as:  PRINIVIL,ZESTRIL Take 20 mg by mouth daily.   ONE DAILY MULTIVITAMIN/IRON Tabs Take 1 tablet by mouth daily with breakfast.   oxyCODONE-acetaminophen 5-325 MG tablet Commonly known as:  PERCOCET/ROXICET Take 1-2 tablets by mouth every 6 (six) hours as needed. What changed:  reasons to take this   polyethylene glycol packet Commonly known as:  MIRALAX / GLYCOLAX Take 17 g by mouth 2 (two) times daily.   vitamin C 500 MG tablet Commonly known as:  ASCORBIC ACID Take 500 mg by mouth daily.      Follow-up Information    [facility] Follow up.   Why:  Home Health Physical Therapy Contact information: [address] [place] [address] [phone]        Wende Neighbors, MD. Schedule an appointment as soon as possible for a visit in 1 week(s).   Specialty:  Internal Medicine Contact information: [place] [address] [phone]          Allergies  Allergen Reactions  . No Known Allergies     Consultations:  None   Procedures/Studies: Dg Chest 2 View  Result Date: 02/04/2017 CLINICAL DATA:  71 year old male with preoperative radiograph.  No chest complaints. EXAM: CHEST  2 VIEW COMPARISON:  None. FINDINGS: The lungs are clear. There is no pleural effusion or pneumothorax. The cardiac silhouette is within normal limits. There is mild degenerative changes of the spine. No acute osseous pathology identified. IMPRESSION: No active cardiopulmonary disease. Electronically Signed   By: Anner Crete M.D.   On: 02/04/2017 07:01   Dg Lumbar Spine 2-3 Views  Result Date: 02/05/2017 CLINICAL DATA:  Lumbar spine fusion. EXAM: LUMBAR SPINE - 2-3 VIEW; DG C-ARM 61-120 MIN COMPARISON:  02/04/2017 . FINDINGS: Lumbar vertebra numbered with the lowest completely segmented lumbar appearing vertebra on lateral view as L5. Posterior, anterior, and interbody fusion. Hardware intact. Anatomic  alignment . Surgical sponge noted over the posterior back. IMPRESSION: Surgical changes lumbosacral spine.  Anatomic alignment. Electronically Signed   By: Marcello Moores  Register   On: 02/05/2017 11:35   Dg Lumbar Spine 2-3 Views  Result Date: 02/04/2017 CLINICAL DATA:  Posterior spinal fusion EXAM: LUMBAR SPINE - 2-3 VIEW; DG C-ARM GT 120 MIN COMPARISON:  02/04/2017, 12/29/2015 FINDINGS: Total fluoroscopy time was 2 minutes 9 seconds. Two low resolution intraoperative spot views of the lumbar spine are submitted. These demonstrate interbody disc devices in the lumbar spine with exact this levels difficult to ascertain on limited two views submitted. IMPRESSION: Intraoperative fluoroscopy provided during lumbar spine surgery. Electronically Signed   By: Donavan Foil M.D.   On: 02/04/2017 16:48   Dg Lumbar Spine 2-3 Views  Result Date: 02/04/2017 CLINICAL DATA:  Anterior fusion L4-S1 EXAM: DG C-ARM 61-120 MIN; LUMBAR SPINE - 2-3 VIEW COMPARISON:  None. FINDINGS: Two intraoperative spot images demonstrate placement of anterior screws in the lower lumbar spine. Exact level is difficult to determine on these intraoperative spot images. No visible hardware complicating feature. IMPRESSION: Anterior fusion in the lower lumbar spine. No visible complicating feature. Electronically Signed   By: Rolm Baptise M.D.   On: 02/04/2017 12:52   Dg Chest Port 1 View  Result Date: 02/18/2017 CLINICAL DATA:  Cough EXAM: PORTABLE CHEST 1 VIEW COMPARISON:  02/04/2017 FINDINGS: Lower lung volumes than on prior exam. Slight increase in retrocardiac opacity may represent areas of atelectasis, trace layering effusion or pneumonia. No acute osseous abnormality. Heart is within normal limits. There is a stable aortic atherosclerosis. No acute nor suspicious osseous abnormality. IMPRESSION: Lower lung volumes than on prior exam. Retrocardiac density is noted with hazy appearance at the left lung base. Trace layering effusion, atelectasis  or pneumonia may account for this appearance. Electronically Signed   By: Ashley Royalty M.D.   On: 02/18/2017 17:50   Dg Abd Portable 1v  Result Date: 02/20/2017 CLINICAL DATA:  Abdominal distention.  Constipation . EXAM: PORTABLE ABDOMEN - 1 VIEW COMPARISON:  02/19/2017. FINDINGS: Soft tissue structures are unremarkable. No significant bowel distention. Moderate stool volume. No free air. Prior lumbosacral spine fusion. Hardware intact. Surgical clips in the pelvis . IMPRESSION: Moderate stool volume.  No bowel distention.  No acute abnormality. Electronically Signed   By: Marcello Moores  Register   On: 02/20/2017 10:59   Dg Abd Portable 1v  Result Date: 02/19/2017 CLINICAL DATA:  Abdominal distension EXAM: PORTABLE ABDOMEN - 1 VIEW COMPARISON:  None. FINDINGS: Gas is distension of small bowel and colon. There is no bowel dilatation to suggest obstruction. There is no evidence of pneumoperitoneum, portal venous gas or pneumatosis. There are no pathologic calcifications along the expected course of the ureters. Posterior lumbar fusion from L2 through S1. IMPRESSION: Gaseous distension of  small bowel colon which may be secondary to an ileus. Electronically Signed   By: Kathreen Devoid   On: 02/19/2017 11:19   Dg C-arm 1-60 Min  Result Date: 02/04/2017 CLINICAL DATA:  Anterior fusion L4-S1 EXAM: DG C-ARM 61-120 MIN; LUMBAR SPINE - 2-3 VIEW COMPARISON:  None. FINDINGS: Two intraoperative spot images demonstrate placement of anterior screws in the lower lumbar spine. Exact level is difficult to determine on these intraoperative spot images. No visible hardware complicating feature. IMPRESSION: Anterior fusion in the lower lumbar spine. No visible complicating feature. Electronically Signed   By: Rolm Baptise M.D.   On: 02/04/2017 12:52   Dg C-arm 61-120 Min  Result Date: 02/05/2017 CLINICAL DATA:  Lumbar spine fusion. EXAM: LUMBAR SPINE - 2-3 VIEW; DG C-ARM 61-120 MIN COMPARISON:  02/04/2017 . FINDINGS: Lumbar vertebra  numbered with the lowest completely segmented lumbar appearing vertebra on lateral view as L5. Posterior, anterior, and interbody fusion. Hardware intact. Anatomic alignment . Surgical sponge noted over the posterior back. IMPRESSION: Surgical changes lumbosacral spine.  Anatomic alignment. Electronically Signed   By: Marcello Moores  Register   On: 02/05/2017 11:35   Dg C-arm Gt 120 Min  Result Date: 02/04/2017 CLINICAL DATA:  Posterior spinal fusion EXAM: LUMBAR SPINE - 2-3 VIEW; DG C-ARM GT 120 MIN COMPARISON:  02/04/2017, 12/29/2015 FINDINGS: Total fluoroscopy time was 2 minutes 9 seconds. Two low resolution intraoperative spot views of the lumbar spine are submitted. These demonstrate interbody disc devices in the lumbar spine with exact this levels difficult to ascertain on limited two views submitted. IMPRESSION: Intraoperative fluoroscopy provided during lumbar spine surgery. Electronically Signed   By: Donavan Foil M.D.   On: 02/04/2017 16:48   Dg Or Local Abdomen  Result Date: 02/04/2017 CLINICAL DATA:  Status post anterior lumbar fusion EXAM: OR LOCAL ABDOMEN COMPARISON:  07/09/2016 FINDINGS: Postsurgical changes are again noted in the pelvis and stable. New postoperative changes are noted over the lower lumbar spine with interbody fusion at L4-5 and L5-S1. Large fixation screws are noted extending into L5 and S1. No retained foreign body is noted. No acute abnormality is seen. IMPRESSION: Postsurgical changes without acute abnormality. These results were called by telephone at the time of interpretation on 02/04/2017 at 12:53 pm to [facility] in the operating room with Dr. Phylliss Bob , who verbally acknowledged these results. Electronically Signed   By: Inez Catalina M.D.   On: 02/04/2017 12:53     Subjective: Some fatigue. Decreased distension. No abdominal pain.  Discharge Exam: Vitals:   02/20/17 0511 02/20/17 1456  BP: (!) 152/80 134/66  Pulse: 90 97  Resp: 18 19  Temp: 97.6 F (36.4 C)  98.1 F (36.7 C)   Vitals:   02/19/17 2224 02/20/17 0511 02/20/17 0528 02/20/17 1456  BP: 129/73 (!) 152/80  134/66  Pulse: 91 90  97  Resp: 18 18  19   Temp: 99 F (37.2 C) 97.6 F (36.4 C)  98.1 F (36.7 C)  TempSrc: Oral     SpO2: 98% 98%  100%  Weight:   90.2 kg (198 lb 12.8 oz)   Height:        General: Pt is alert, awake, not in acute distress Cardiovascular: RRR, S1/S2 +, no rubs, no gallops Respiratory: CTA bilaterally, no wheezing, no rhonchi Abdominal: Soft, NT, slightly distended, mild bowel sounds Extremities: no edema, no cyanosis    The results of significant diagnostics from this hospitalization (including imaging, microbiology, ancillary and laboratory) are listed below for  reference.     Microbiology: Recent Results (from the past 240 hour(s))  Culture, blood (Routine x 2)     Status: None (Preliminary result)   Collection Time: 02/17/17  4:53 PM  Result Value Ref Range Status   Specimen Description BLOOD RIGHT ANTECUBITAL  Final   Special Requests   Final    BOTTLES DRAWN AEROBIC AND ANAEROBIC Blood Culture adequate volume   Culture NO GROWTH 3 DAYS  Final   Report Status PENDING  Incomplete  Culture, blood (Routine x 2)     Status: Abnormal   Collection Time: 02/17/17  5:56 PM  Result Value Ref Range Status   Specimen Description BLOOD RIGHT ANTECUBITAL  Final   Special Requests   Final    BOTTLES DRAWN AEROBIC AND ANAEROBIC Blood Culture adequate volume   Culture  Setup Time   Final    GRAM NEGATIVE RODS IN BOTH AEROBIC AND ANAEROBIC BOTTLES CRITICAL RESULT CALLED TO, READ BACK BY AND VERIFIED WITH: M TURNER 02/18/17 @ 0838 M VESTAL    Culture ESCHERICHIA COLI (A)  Final   Report Status 02/20/2017 FINAL  Final   Organism ID, Bacteria ESCHERICHIA COLI  Final      Susceptibility   Escherichia coli - MIC*    AMPICILLIN 8 SENSITIVE Sensitive     CEFAZOLIN <=4 SENSITIVE Sensitive     CEFEPIME <=1 SENSITIVE Sensitive     CEFTAZIDIME <=1 SENSITIVE  Sensitive     CEFTRIAXONE <=1 SENSITIVE Sensitive     CIPROFLOXACIN <=0.25 SENSITIVE Sensitive     GENTAMICIN <=1 SENSITIVE Sensitive     IMIPENEM <=0.25 SENSITIVE Sensitive     TRIMETH/SULFA <=20 SENSITIVE Sensitive     AMPICILLIN/SULBACTAM <=2 SENSITIVE Sensitive     PIP/TAZO <=4 SENSITIVE Sensitive     Extended ESBL NEGATIVE Sensitive     * ESCHERICHIA COLI  Blood Culture ID Panel (Reflexed)     Status: Abnormal   Collection Time: 02/17/17  5:56 PM  Result Value Ref Range Status   Enterococcus species NOT DETECTED NOT DETECTED Final   Listeria monocytogenes NOT DETECTED NOT DETECTED Final   Staphylococcus species NOT DETECTED NOT DETECTED Final   Staphylococcus aureus NOT DETECTED NOT DETECTED Final   Streptococcus species NOT DETECTED NOT DETECTED Final   Streptococcus agalactiae NOT DETECTED NOT DETECTED Final   Streptococcus pneumoniae NOT DETECTED NOT DETECTED Final   Streptococcus pyogenes NOT DETECTED NOT DETECTED Final   Acinetobacter baumannii NOT DETECTED NOT DETECTED Final   Enterobacteriaceae species DETECTED (A) NOT DETECTED Final    Comment: Enterobacteriaceae represent a large family of gram-negative bacteria, not a single organism. CRITICAL RESULT CALLED TO, READ BACK BY AND VERIFIED WITH: M TURNER 02/18/17 @ 0838 M VESTAL    Enterobacter cloacae complex NOT DETECTED NOT DETECTED Final   Escherichia coli DETECTED (A) NOT DETECTED Final    Comment: CRITICAL RESULT CALLED TO, READ BACK BY AND VERIFIED WITH: M TURNER 02/18/17 @ 0838 M VESTAL    Klebsiella oxytoca NOT DETECTED NOT DETECTED Final   Klebsiella pneumoniae NOT DETECTED NOT DETECTED Final   Proteus species NOT DETECTED NOT DETECTED Final   Serratia marcescens NOT DETECTED NOT DETECTED Final   Carbapenem resistance NOT DETECTED NOT DETECTED Final   Haemophilus influenzae NOT DETECTED NOT DETECTED Final   Neisseria meningitidis NOT DETECTED NOT DETECTED Final   Pseudomonas aeruginosa NOT DETECTED NOT  DETECTED Final   Candida albicans NOT DETECTED NOT DETECTED Final   Candida glabrata NOT DETECTED NOT DETECTED Final  Candida krusei NOT DETECTED NOT DETECTED Final   Candida parapsilosis NOT DETECTED NOT DETECTED Final   Candida tropicalis NOT DETECTED NOT DETECTED Final  Culture, Urine     Status: Abnormal   Collection Time: 02/17/17  8:09 PM  Result Value Ref Range Status   Specimen Description URINE, RANDOM  Final   Special Requests NONE  Final   Culture >=100,000 COLONIES/mL ESCHERICHIA COLI (A)  Final   Report Status 02/20/2017 FINAL  Final   Organism ID, Bacteria ESCHERICHIA COLI (A)  Final      Susceptibility   Escherichia coli - MIC*    AMPICILLIN 4 SENSITIVE Sensitive     CEFAZOLIN <=4 SENSITIVE Sensitive     CEFTRIAXONE <=1 SENSITIVE Sensitive     CIPROFLOXACIN <=0.25 SENSITIVE Sensitive     GENTAMICIN <=1 SENSITIVE Sensitive     IMIPENEM <=0.25 SENSITIVE Sensitive     NITROFURANTOIN <=16 SENSITIVE Sensitive     TRIMETH/SULFA <=20 SENSITIVE Sensitive     AMPICILLIN/SULBACTAM <=2 SENSITIVE Sensitive     PIP/TAZO <=4 SENSITIVE Sensitive     Extended ESBL NEGATIVE Sensitive     * >=100,000 COLONIES/mL ESCHERICHIA COLI     Labs: BNP (last 3 results) No results for input(s): BNP in the last 8760 hours. Basic Metabolic Panel:  Recent Labs Lab 02/17/17 1453 02/18/17 0159 02/19/17 0520  NA 136 136 133*  K 4.3 3.8 3.7  CL 102 106 103  CO2 23 20* 22  GLUCOSE 126* 160* 154*  BUN 18 19 32*  CREATININE 1.52* 1.43* 1.07  CALCIUM 8.8* 7.9* 7.7*   Liver Function Tests:  Recent Labs Lab 02/17/17 1453 02/18/17 0159  AST 30 20  ALT 23 19  ALKPHOS 129* 110  BILITOT 1.4* 1.2  PROT 7.1 5.9*  ALBUMIN 3.2* 2.7*   No results for input(s): LIPASE, AMYLASE in the last 168 hours. No results for input(s): AMMONIA in the last 168 hours. CBC:  Recent Labs Lab 02/17/17 1453 02/18/17 0159 02/18/17 2028 02/19/17 0520 02/20/17 0530  WBC 17.8* 17.8*  --  12.2* 11.9*   NEUTROABS 15.3* 16.0*  --   --   --   HGB 8.6* 7.4* 8.6* 7.8* 7.7*  HCT 27.4* 23.4* 26.2* 23.8* 24.3*  MCV 89.8 88.6  --  88.1 88.0  PLT 444* 372  --  343 375   Cardiac Enzymes: No results for input(s): CKTOTAL, CKMB, CKMBINDEX, TROPONINI in the last 168 hours. BNP: Invalid input(s): POCBNP CBG:  Recent Labs Lab 02/18/17 0854 02/19/17 0808 02/20/17 0759  GLUCAP 124* 124* 131*   D-Dimer No results for input(s): DDIMER in the last 72 hours. Hgb A1c No results for input(s): HGBA1C in the last 72 hours. Lipid Profile No results for input(s): CHOL, HDL, LDLCALC, TRIG, CHOLHDL, LDLDIRECT in the last 72 hours. Thyroid function studies No results for input(s): TSH, T4TOTAL, T3FREE, THYROIDAB in the last 72 hours.  Invalid input(s): FREET3 Anemia work up No results for input(s): VITAMINB12, FOLATE, FERRITIN, TIBC, IRON, RETICCTPCT in the last 72 hours. Urinalysis    Component Value Date/Time   COLORURINE YELLOW 02/17/2017 2009   APPEARANCEUR HAZY (A) 02/17/2017 2009   LABSPEC 1.019 02/17/2017 2009   PHURINE 5.0 02/17/2017 2009   GLUCOSEU NEGATIVE 02/17/2017 2009   HGBUR NEGATIVE 02/17/2017 2009   [place] NEGATIVE 02/17/2017 2009   [place] NEGATIVE 02/17/2017 2009   PROTEINUR 30 (A) 02/17/2017 2009   NITRITE NEGATIVE 02/17/2017 2009   LEUKOCYTESUR MODERATE (A) 02/17/2017 2009   Sepsis Labs Invalid input(s):  PROCALCITONIN,  WBC,  LACTICIDVEN Microbiology Recent Results (from the past 240 hour(s))  Culture, blood (Routine x 2)     Status: None (Preliminary result)   Collection Time: 02/17/17  4:53 PM  Result Value Ref Range Status   Specimen Description BLOOD RIGHT ANTECUBITAL  Final   Special Requests   Final    BOTTLES DRAWN AEROBIC AND ANAEROBIC Blood Culture adequate volume   Culture NO GROWTH 3 DAYS  Final   Report Status PENDING  Incomplete  Culture, blood (Routine x 2)     Status: Abnormal   Collection Time: 02/17/17  5:56 PM  Result Value Ref Range  Status   Specimen Description BLOOD RIGHT ANTECUBITAL  Final   Special Requests   Final    BOTTLES DRAWN AEROBIC AND ANAEROBIC Blood Culture adequate volume   Culture  Setup Time   Final    GRAM NEGATIVE RODS IN BOTH AEROBIC AND ANAEROBIC BOTTLES CRITICAL RESULT CALLED TO, READ BACK BY AND VERIFIED WITH: M TURNER 02/18/17 @ 0838 M VESTAL    Culture ESCHERICHIA COLI (A)  Final   Report Status 02/20/2017 FINAL  Final   Organism ID, Bacteria ESCHERICHIA COLI  Final      Susceptibility   Escherichia coli - MIC*    AMPICILLIN 8 SENSITIVE Sensitive     CEFAZOLIN <=4 SENSITIVE Sensitive     CEFEPIME <=1 SENSITIVE Sensitive     CEFTAZIDIME <=1 SENSITIVE Sensitive     CEFTRIAXONE <=1 SENSITIVE Sensitive     CIPROFLOXACIN <=0.25 SENSITIVE Sensitive     GENTAMICIN <=1 SENSITIVE Sensitive     IMIPENEM <=0.25 SENSITIVE Sensitive     TRIMETH/SULFA <=20 SENSITIVE Sensitive     AMPICILLIN/SULBACTAM <=2 SENSITIVE Sensitive     PIP/TAZO <=4 SENSITIVE Sensitive     Extended ESBL NEGATIVE Sensitive     * ESCHERICHIA COLI  Blood Culture ID Panel (Reflexed)     Status: Abnormal   Collection Time: 02/17/17  5:56 PM  Result Value Ref Range Status   Enterococcus species NOT DETECTED NOT DETECTED Final   Listeria monocytogenes NOT DETECTED NOT DETECTED Final   Staphylococcus species NOT DETECTED NOT DETECTED Final   Staphylococcus aureus NOT DETECTED NOT DETECTED Final   Streptococcus species NOT DETECTED NOT DETECTED Final   Streptococcus agalactiae NOT DETECTED NOT DETECTED Final   Streptococcus pneumoniae NOT DETECTED NOT DETECTED Final   Streptococcus pyogenes NOT DETECTED NOT DETECTED Final   Acinetobacter baumannii NOT DETECTED NOT DETECTED Final   Enterobacteriaceae species DETECTED (A) NOT DETECTED Final    Comment: Enterobacteriaceae represent a large family of gram-negative bacteria, not a single organism. CRITICAL RESULT CALLED TO, READ BACK BY AND VERIFIED WITH: M TURNER 02/18/17 @ 0838 M  VESTAL    Enterobacter cloacae complex NOT DETECTED NOT DETECTED Final   Escherichia coli DETECTED (A) NOT DETECTED Final    Comment: CRITICAL RESULT CALLED TO, READ BACK BY AND VERIFIED WITH: M TURNER 02/18/17 @ 0838 M VESTAL    Klebsiella oxytoca NOT DETECTED NOT DETECTED Final   Klebsiella pneumoniae NOT DETECTED NOT DETECTED Final   Proteus species NOT DETECTED NOT DETECTED Final   Serratia marcescens NOT DETECTED NOT DETECTED Final   Carbapenem resistance NOT DETECTED NOT DETECTED Final   Haemophilus influenzae NOT DETECTED NOT DETECTED Final   Neisseria meningitidis NOT DETECTED NOT DETECTED Final   Pseudomonas aeruginosa NOT DETECTED NOT DETECTED Final   Candida albicans NOT DETECTED NOT DETECTED Final   Candida glabrata NOT DETECTED NOT DETECTED Final  Candida krusei NOT DETECTED NOT DETECTED Final   Candida parapsilosis NOT DETECTED NOT DETECTED Final   Candida tropicalis NOT DETECTED NOT DETECTED Final  Culture, Urine     Status: Abnormal   Collection Time: 02/17/17  8:09 PM  Result Value Ref Range Status   Specimen Description URINE, RANDOM  Final   Special Requests NONE  Final   Culture >=100,000 COLONIES/mL ESCHERICHIA COLI (A)  Final   Report Status 02/20/2017 FINAL  Final   Organism ID, Bacteria ESCHERICHIA COLI (A)  Final      Susceptibility   Escherichia coli - MIC*    AMPICILLIN 4 SENSITIVE Sensitive     CEFAZOLIN <=4 SENSITIVE Sensitive     CEFTRIAXONE <=1 SENSITIVE Sensitive     CIPROFLOXACIN <=0.25 SENSITIVE Sensitive     GENTAMICIN <=1 SENSITIVE Sensitive     IMIPENEM <=0.25 SENSITIVE Sensitive     NITROFURANTOIN <=16 SENSITIVE Sensitive     TRIMETH/SULFA <=20 SENSITIVE Sensitive     AMPICILLIN/SULBACTAM <=2 SENSITIVE Sensitive     PIP/TAZO <=4 SENSITIVE Sensitive     Extended ESBL NEGATIVE Sensitive     * >=100,000 COLONIES/mL ESCHERICHIA COLI     Time coordinating discharge: Over 30 minutes  SIGNED:   Cordelia Poche, MD [facility] 02/20/2017, 3:05 PM Pager [phone]  If 7PM-7AM, please contact night-coverage [email or web address] Password [email or web address]

## 2017-02-20 NOTE — Care Management Note (Addendum)
Case Management Note  Patient Details  Name: Frank [name] MRN: [id number] Date of Birth: 12-18-1945  Subjective/Objective:   UTI                 Action/Plan: Discharge Planning: NCM spoke to pt and wife at bedside. Active with [facility] for [facility] PT. Contacted [facility] to make aware of dc home today with [place] PT.  Has RW, cane and bedside commode.  PCP Celene Squibb MD  Expected Discharge Date:  02/20/17               Expected Discharge Plan:  [place]  In-House Referral:  NA  Discharge planning Services  CM Consult  Post Acute Care Choice:  Home Health Choice offered to:  Patient  DME Arranged:  N/A DME Agency:  NA  HH Arranged:  PT [place] Agency:  [place]  Status of Service:  Completed, signed off  If discussed at [place] of Stay Meetings, dates discussed:    Additional Comments:  Erenest Rasher, RN 02/20/2017, 3:07 PM

## 2017-02-20 NOTE — Progress Notes (Signed)
Nsg Discharge Note  Admit Date:  02/17/2017 Discharge date: 02/20/2017   Durenda Age to be D/C'd Home per MD order.  AVS completed.  Copy for chart, and copy for patient signed, and dated. Patient/caregiver able to verbalize understanding.  Discharge Medication: Allergies as of 02/20/2017      Reactions   No Known Allergies       Medication List    STOP taking these medications   cyanocobalamin 500 MCG tablet     TAKE these medications   amoxicillin-clavulanate 875-125 MG tablet Commonly known as:  AUGMENTIN Take 1 tablet by mouth every 12 (twelve) hours.   atorvastatin 40 MG tablet Commonly known as:  LIPITOR Take 40 mg by mouth at bedtime.   baclofen 10 MG tablet Commonly known as:  LIORESAL Take 0.5 tablets (5 mg total) by mouth 3 (three) times daily. Use for hiccups.   diazepam 5 MG tablet Commonly known as:  VALIUM Take 5 mg by mouth every 6 (six) hours as needed for muscle spasms.   feeding supplement (ENSURE ENLIVE) Liqd Take 237 mLs by mouth 2 (two) times daily between meals. Start taking on:  02/21/2017   lisinopril 20 MG tablet Commonly known as:  PRINIVIL,ZESTRIL Take 20 mg by mouth daily.   ONE DAILY MULTIVITAMIN/IRON Tabs Take 1 tablet by mouth daily with breakfast.   oxyCODONE-acetaminophen 5-325 MG tablet Commonly known as:  PERCOCET/ROXICET Take 1-2 tablets by mouth every 6 (six) hours as needed. What changed:  reasons to take this   polyethylene glycol packet Commonly known as:  MIRALAX / GLYCOLAX Take 17 g by mouth 2 (two) times daily.   vitamin C 500 MG tablet Commonly known as:  ASCORBIC ACID Take 500 mg by mouth daily.       Discharge Assessment: Vitals:   02/20/17 0511 02/20/17 1456  BP: (!) 152/80 134/66  Pulse: 90 97  Resp: 18 19  Temp: 97.6 F (36.4 C) 98.1 F (36.7 C)   Skin clean, dry and intact without evidence of skin break down, no evidence of skin tears noted. IV catheter discontinued intact. Site without signs and  symptoms of complications - no redness or edema noted at insertion site, patient denies c/o pain - only slight tenderness at site.  Dressing with slight pressure applied.  D/c Instructions-Education: Discharge instructions given to patient/family with verbalized understanding. D/c education completed with patient/family including follow up instructions, medication list, d/c activities limitations if indicated, with other d/c instructions as indicated by MD - patient able to verbalize understanding, all questions fully answered. Patient instructed to return to ED, call 911, or call MD for any changes in condition.  Patient escorted via [place], and D/C home via private auto.  Salley Slaughter, RN 02/20/2017 3:41 PM

## 2017-02-20 NOTE — Progress Notes (Signed)
[facility]  Patient Status: Active (receiving services up to time of hospitalization)  [facility] is providing the following services: PT  If patient discharges after hours, please call [phone].   Janae Sauce 02/20/2017, 9:32 AM

## 2017-02-22 LAB — CULTURE, BLOOD (ROUTINE X 2)
Culture: NO GROWTH
Special Requests: ADEQUATE

## 2017-02-25 NOTE — Discharge Summary (Signed)
Patient ID: Frank [name] MRN: [id number] DOB/AGE: [birth year] 71 y.o.  Admit date: 02/04/2017 Discharge date: 02/07/2017  Admission Diagnoses:  Active Problems:   Lumbar radiculopathy   Discharge Diagnoses:  Same  Past Medical History:  Diagnosis Date  . Arthritis    left knee  . Cancer [facility])    colon cancer  . History of kidney stones   . Hypercholesteremia   . Hypertension   . Sleep apnea    uses cpap    Surgeries: Procedure(s): LUMBAR 2-3, LUMBAR 3-4, LUMBAR 4-5, LUMBAR 5-SACRUM 1 POSTERIOR SPINAL FUSION WITH INSTRUMENTATION AND ALLOGRAFT on 02/04/2017 - 02/05/2017   Consultants: None  Discharged Condition: Improved  Hospital Course: Frank [name] is an 71 y.o. male who was admitted 02/04/2017 for operative treatment of radiculopathy. Patient has severe unremitting pain that affects sleep, daily activities, and work/hobbies. After pre-op clearance the patient was taken to the operating room on 02/04/2017 - 02/05/2017 and underwent  Procedure(s): LUMBAR 2-3, LUMBAR 3-4, LUMBAR 4-5, LUMBAR 5-SACRUM 1 POSTERIOR SPINAL FUSION WITH INSTRUMENTATION AND ALLOGRAFT.    Patient was given perioperative antibiotics:  Anti-infectives    Start     Dose/Rate Route Frequency Ordered Stop   02/06/17 0030  ceFAZolin (ANCEF) IVPB 2g/100 mL premix     2 g 200 mL/hr over 30 Minutes Intravenous Every 8 hours 02/05/17 1412 02/06/17 0913   02/05/17 0030  ceFAZolin (ANCEF) IVPB 2g/100 mL premix     2 g 200 mL/hr over 30 Minutes Intravenous Every 8 hours 02/04/17 2024 02/05/17 1200   02/04/17 0635  ceFAZolin (ANCEF) IVPB 2g/100 mL premix  Status:  Discontinued     2 g 200 mL/hr over 30 Minutes Intravenous On call to O.R. 02/04/17 4174 02/04/17 0641   02/04/17 0630  ceFAZolin (ANCEF) IVPB 2g/100 mL premix     2 g 200 mL/hr over 30 Minutes Intravenous On call to O.R. 02/04/17 0630 02/04/17 1630       Patient was given sequential compression devices, early ambulation to prevent  DVT.  Patient benefited maximally from hospital stay and there were no complications.    Recent vital signs: BP (!) 129/57 (BP Location: Left Arm)   Pulse 80   Temp 98.6 F (37 C) (Oral)   Resp 18   Ht 6\' 2"  (1.88 m)   Wt 91.1 kg (200 lb 13.4 oz)   SpO2 98%   BMI 25.79 kg/m    Discharge Medications:   Allergies as of 02/07/2017      Reactions   No Known Allergies       Medication List    TAKE these medications   atorvastatin 40 MG tablet Commonly known as:  LIPITOR Take 40 mg by mouth at bedtime.   lisinopril 20 MG tablet Commonly known as:  PRINIVIL,ZESTRIL Take 20 mg by mouth daily.   oxyCODONE-acetaminophen 5-325 MG tablet Commonly known as:  PERCOCET/ROXICET Take 1-2 tablets by mouth every 6 (six) hours as needed. What changed:  reasons to take this   vitamin C 500 MG tablet Commonly known as:  ASCORBIC ACID Take 500 mg by mouth daily.       Diagnostic Studies: Dg Chest 2 View  Result Date: 02/04/2017 CLINICAL DATA:  71 year old male with preoperative radiograph. No chest complaints. EXAM: CHEST  2 VIEW COMPARISON:  None. FINDINGS: The lungs are clear. There is no pleural effusion or pneumothorax. The cardiac silhouette is within normal limits. There is mild degenerative changes of the spine. No acute  osseous pathology identified. IMPRESSION: No active cardiopulmonary disease. Electronically Signed   By: Anner Crete M.D.   On: 02/04/2017 07:01   Dg Lumbar Spine 2-3 Views  Result Date: 02/05/2017 CLINICAL DATA:  Lumbar spine fusion. EXAM: LUMBAR SPINE - 2-3 VIEW; DG C-ARM 61-120 MIN COMPARISON:  02/04/2017 . FINDINGS: Lumbar vertebra numbered with the lowest completely segmented lumbar appearing vertebra on lateral view as L5. Posterior, anterior, and interbody fusion. Hardware intact. Anatomic alignment . Surgical sponge noted over the posterior back. IMPRESSION: Surgical changes lumbosacral spine.  Anatomic alignment. Electronically Signed   By: Marcello Moores   Register   On: 02/05/2017 11:35   Dg Lumbar Spine 2-3 Views  Result Date: 02/04/2017 CLINICAL DATA:  Posterior spinal fusion EXAM: LUMBAR SPINE - 2-3 VIEW; DG C-ARM GT 120 MIN COMPARISON:  02/04/2017, 12/29/2015 FINDINGS: Total fluoroscopy time was 2 minutes 9 seconds. Two low resolution intraoperative spot views of the lumbar spine are submitted. These demonstrate interbody disc devices in the lumbar spine with exact this levels difficult to ascertain on limited two views submitted. IMPRESSION: Intraoperative fluoroscopy provided during lumbar spine surgery. Electronically Signed   By: Donavan Foil M.D.   On: 02/04/2017 16:48   Dg Lumbar Spine 2-3 Views  Result Date: 02/04/2017 CLINICAL DATA:  Anterior fusion L4-S1 EXAM: DG C-ARM 61-120 MIN; LUMBAR SPINE - 2-3 VIEW COMPARISON:  None. FINDINGS: Two intraoperative spot images demonstrate placement of anterior screws in the lower lumbar spine. Exact level is difficult to determine on these intraoperative spot images. No visible hardware complicating feature. IMPRESSION: Anterior fusion in the lower lumbar spine. No visible complicating feature. Electronically Signed   By: Rolm Baptise M.D.   On: 02/04/2017 12:52   Dg Chest Port 1 View  Result Date: 02/18/2017 CLINICAL DATA:  Cough EXAM: PORTABLE CHEST 1 VIEW COMPARISON:  02/04/2017 FINDINGS: Lower lung volumes than on prior exam. Slight increase in retrocardiac opacity may represent areas of atelectasis, trace layering effusion or pneumonia. No acute osseous abnormality. Heart is within normal limits. There is a stable aortic atherosclerosis. No acute nor suspicious osseous abnormality. IMPRESSION: Lower lung volumes than on prior exam. Retrocardiac density is noted with hazy appearance at the left lung base. Trace layering effusion, atelectasis or pneumonia may account for this appearance. Electronically Signed   By: Ashley Royalty M.D.   On: 02/18/2017 17:50   Dg Abd Portable 1v  Result Date:  02/20/2017 CLINICAL DATA:  Abdominal distention.  Constipation . EXAM: PORTABLE ABDOMEN - 1 VIEW COMPARISON:  02/19/2017. FINDINGS: Soft tissue structures are unremarkable. No significant bowel distention. Moderate stool volume. No free air. Prior lumbosacral spine fusion. Hardware intact. Surgical clips in the pelvis . IMPRESSION: Moderate stool volume.  No bowel distention.  No acute abnormality. Electronically Signed   By: Marcello Moores  Register   On: 02/20/2017 10:59   Dg Abd Portable 1v  Result Date: 02/19/2017 CLINICAL DATA:  Abdominal distension EXAM: PORTABLE ABDOMEN - 1 VIEW COMPARISON:  None. FINDINGS: Gas is distension of small bowel and colon. There is no bowel dilatation to suggest obstruction. There is no evidence of pneumoperitoneum, portal venous gas or pneumatosis. There are no pathologic calcifications along the expected course of the ureters. Posterior lumbar fusion from L2 through S1. IMPRESSION: Gaseous distension of small bowel colon which may be secondary to an ileus. Electronically Signed   By: Kathreen Devoid   On: 02/19/2017 11:19   Dg C-arm 1-60 Min  Result Date: 02/04/2017 CLINICAL DATA:  Anterior fusion L4-S1 EXAM:  DG C-ARM 61-120 MIN; LUMBAR SPINE - 2-3 VIEW COMPARISON:  None. FINDINGS: Two intraoperative spot images demonstrate placement of anterior screws in the lower lumbar spine. Exact level is difficult to determine on these intraoperative spot images. No visible hardware complicating feature. IMPRESSION: Anterior fusion in the lower lumbar spine. No visible complicating feature. Electronically Signed   By: Rolm Baptise M.D.   On: 02/04/2017 12:52   Dg C-arm 61-120 Min  Result Date: 02/05/2017 CLINICAL DATA:  Lumbar spine fusion. EXAM: LUMBAR SPINE - 2-3 VIEW; DG C-ARM 61-120 MIN COMPARISON:  02/04/2017 . FINDINGS: Lumbar vertebra numbered with the lowest completely segmented lumbar appearing vertebra on lateral view as L5. Posterior, anterior, and interbody fusion. Hardware  intact. Anatomic alignment . Surgical sponge noted over the posterior back. IMPRESSION: Surgical changes lumbosacral spine.  Anatomic alignment. Electronically Signed   By: Marcello Moores  Register   On: 02/05/2017 11:35   Dg C-arm Gt 120 Min  Result Date: 02/04/2017 CLINICAL DATA:  Posterior spinal fusion EXAM: LUMBAR SPINE - 2-3 VIEW; DG C-ARM GT 120 MIN COMPARISON:  02/04/2017, 12/29/2015 FINDINGS: Total fluoroscopy time was 2 minutes 9 seconds. Two low resolution intraoperative spot views of the lumbar spine are submitted. These demonstrate interbody disc devices in the lumbar spine with exact this levels difficult to ascertain on limited two views submitted. IMPRESSION: Intraoperative fluoroscopy provided during lumbar spine surgery. Electronically Signed   By: Donavan Foil M.D.   On: 02/04/2017 16:48   Dg Or Local Abdomen  Result Date: 02/04/2017 CLINICAL DATA:  Status post anterior lumbar fusion EXAM: OR LOCAL ABDOMEN COMPARISON:  07/09/2016 FINDINGS: Postsurgical changes are again noted in the pelvis and stable. New postoperative changes are noted over the lower lumbar spine with interbody fusion at L4-5 and L5-S1. Large fixation screws are noted extending into L5 and S1. No retained foreign body is noted. No acute abnormality is seen. IMPRESSION: Postsurgical changes without acute abnormality. These results were called by telephone at the time of interpretation on 02/04/2017 at 12:53 pm to [facility] in the operating room with Dr. Phylliss Bob , who verbally acknowledged these results. Electronically Signed   By: Inez Catalina M.D.   On: 02/04/2017 12:53    Disposition: 01-Home or Self Care   POD #2 and 3 s/p L2-S1 A/P fusion, doing well  - up with PT/OT, encourage ambulation - Percocet for pain, Valium for muscle spasms -Written scripts for pain signed and in chart -D/C instructions sheet printed and in chart -D/C today  -F/U in office 2 weeks   Signed: Justice Britain 02/25/2017, 1:12  PM

## 2017-04-20 NOTE — Anesthesia Postprocedure Evaluation (Signed)
Anesthesia Post Note  Patient: Frank [name]  Procedure(s) Performed: Procedure(s) (LRB): LUMBAR 5-SACRUM 1 ANTERIOR LUMBAR FUSION WITH INSTRUMENTATION AND ALLOGRAFT (N/A) RIGHT SIDED LUMBAR 3-4, LUMBAR 4-5 LATERAL INTERBODY FUSION WITH INSTRUMENTATION AND ALLOGRAFT (Right) ABDOMINAL EXPOSURE (N/A)     Anesthesia Post Evaluation  Last Vitals:  Vitals:   02/07/17 0537 02/07/17 0933  BP: 132/64 (!) 129/57  Pulse: 75 80  Resp: 16 18  Temp: 37 C 37 C    Last Pain:  Vitals:   02/07/17 0958  TempSrc:   PainSc: 0-No pain                 Classie Weng S

## 2017-04-20 NOTE — Addendum Note (Signed)
Addendum  created 04/20/17 1231 by Myrtie Soman, MD   Sign clinical note

## 2017-04-24 NOTE — Addendum Note (Signed)
Addendum  created 04/24/17 1121 by Lyn Hollingshead, MD   Sign clinical note

## 2017-06-16 ENCOUNTER — Emergency Department (HOSPITAL_COMMUNITY): Payer: Medicare Other

## 2017-06-16 ENCOUNTER — Encounter (HOSPITAL_COMMUNITY): Payer: Self-pay | Admitting: Emergency Medicine

## 2017-06-16 ENCOUNTER — Emergency Department (HOSPITAL_COMMUNITY)
Admission: EM | Admit: 2017-06-16 | Discharge: 2017-06-16 | Disposition: A | Discharge status: Home / Self Care | Payer: Medicare Other | Attending: Emergency Medicine | Admitting: Emergency Medicine

## 2017-06-16 DIAGNOSIS — R5383 Other fatigue: Secondary | ICD-10-CM | POA: Diagnosis not present

## 2017-06-16 DIAGNOSIS — R531 Weakness: Secondary | ICD-10-CM | POA: Diagnosis present

## 2017-06-16 DIAGNOSIS — Z85038 Personal history of other malignant neoplasm of large intestine: Secondary | ICD-10-CM | POA: Diagnosis not present

## 2017-06-16 DIAGNOSIS — I10 Essential (primary) hypertension: Secondary | ICD-10-CM | POA: Diagnosis not present

## 2017-06-16 DIAGNOSIS — Z79899 Other long term (current) drug therapy: Secondary | ICD-10-CM | POA: Insufficient documentation

## 2017-06-16 DIAGNOSIS — R509 Fever, unspecified: Secondary | ICD-10-CM | POA: Diagnosis not present

## 2017-06-16 LAB — BASIC METABOLIC PANEL WITH GFR
Anion gap: 7 (ref 5–15)
BUN: 11 mg/dL (ref 6–20)
CO2: 25 mmol/L (ref 22–32)
Calcium: 9.5 mg/dL (ref 8.9–10.3)
Chloride: 106 mmol/L (ref 101–111)
Creatinine, Ser: 0.93 mg/dL (ref 0.61–1.24)
GFR calc Af Amer: >60 mL/min (ref >60)
GFR calc non Af Amer: >60 mL/min (ref >60)
Glucose, Bld: 134 mg/dL — ABNORMAL HIGH (ref 65–99)
Potassium: 4.3 mmol/L (ref 3.5–5.1)
Sodium: 138 mmol/L (ref 135–145)

## 2017-06-16 LAB — CBC
HCT: 37.9 % — ABNORMAL LOW (ref 39.0–52.0)
Hemoglobin: 12.2 g/dL — ABNORMAL LOW (ref 13.0–17.0)
MCH: 27.9 pg (ref 26.0–34.0)
MCHC: 32.2 g/dL (ref 30.0–36.0)
MCV: 86.5 fL (ref 78.0–100.0)
Platelets: 249 K/uL (ref 150–400)
RBC: 4.38 MIL/uL (ref 4.22–5.81)
RDW: 16.4 % — ABNORMAL HIGH (ref 11.5–15.5)
WBC: 11.5 K/uL — ABNORMAL HIGH (ref 4.0–10.5)

## 2017-06-16 LAB — HEPATIC FUNCTION PANEL
ALT: 16 U/L — ABNORMAL LOW (ref 17–63)
AST: 19 U/L (ref 15–41)
Albumin: 4 g/dL (ref 3.5–5.0)
Alkaline Phosphatase: 109 U/L (ref 38–126)
Bilirubin, Direct: 0.1 mg/dL (ref 0.1–0.5)
Indirect Bilirubin: 0.8 mg/dL (ref 0.3–0.9)
Total Bilirubin: 0.9 mg/dL (ref 0.3–1.2)
Total Protein: 8.1 g/dL (ref 6.5–8.1)

## 2017-06-16 LAB — URINALYSIS, ROUTINE W REFLEX MICROSCOPIC
Bilirubin Urine: NEGATIVE
Glucose, UA: NEGATIVE mg/dL
Hgb urine dipstick: NEGATIVE
Ketones, ur: NEGATIVE mg/dL
Leukocytes, UA: NEGATIVE
Nitrite: NEGATIVE
Protein, ur: NEGATIVE mg/dL
Specific Gravity, Urine: 1.019 (ref 1.005–1.030)
pH: 5 (ref 5.0–8.0)

## 2017-06-16 LAB — I-STAT CG4 LACTIC ACID, ED: Lactic Acid, Venous: 1.3 mmol/L (ref 0.5–1.9)

## 2017-06-16 LAB — TYPE AND SCREEN
ABO/RH(D): B POS
Antibody Screen: NEGATIVE

## 2017-06-16 LAB — CBG MONITORING, ED: Glucose-Capillary: 98 mg/dL (ref 65–99)

## 2017-06-16 MED ORDER — SODIUM CHLORIDE 0.9 % IV BOLUS (SEPSIS)
1000.0000 mL | Freq: Once | INTRAVENOUS | Status: AC
  Administered 2017-06-16: 1000 mL via INTRAVENOUS

## 2017-06-16 MED ORDER — ACETAMINOPHEN 500 MG PO TABS
1000.0000 mg | ORAL_TABLET | Freq: Once | ORAL | Status: AC
  Administered 2017-06-16: 1000 mg via ORAL
  Filled 2017-06-16: qty 2

## 2017-06-16 MED ORDER — IBUPROFEN 400 MG PO TABS
600.0000 mg | ORAL_TABLET | Freq: Once | ORAL | Status: AC
  Administered 2017-06-16: 600 mg via ORAL
  Filled 2017-06-16: qty 2

## 2017-06-16 NOTE — ED Triage Notes (Signed)
Patient states that he has felt weak for the past 3 days. Patient states fatigue. Patient's family states that Frank [name] has been treated for low hemoglobin since spinal surgery in April.

## 2017-06-16 NOTE — ED Notes (Signed)
cbg of 98.

## 2017-06-16 NOTE — ED Provider Notes (Signed)
[facility] DEPT Provider Note   CSN: [id number] Arrival date & time: 06/16/17  1804     History   Chief Complaint Chief Complaint  Patient presents with  . Weakness    HPI Frank [name] is a 71 y.o. male.  HPI Patient is a 71 year old male who presents the emergency department with increasing generalized weakness over the past 3 days with low-grade temp of 100 today.  He reports feeling generally fatigued.  Denies chest pain and shortness of breath.  No recent cough or congestion.  Denies nausea vomiting diarrhea.  Denies abdominal pain.  No dysuria or urinary frequency.  Reports spine surgery in April 2018 without any back pain at this time.  No new weakness in his arms and legs.  No other complaints.  Symptoms are mild to moderate in severity.   Past Medical History:  Diagnosis Date  . Arthritis    left knee  . Cancer [facility])    colon cancer  . History of kidney stones   . Hypercholesteremia   . Hypertension   . Sleep apnea    uses cpap    Patient Active Problem List   Diagnosis Date Noted  . Hiccups 02/20/2017  . Acute lower UTI 02/17/2017  . AKI (acute kidney injury) ([place]) 02/17/2017  . Hypotension (arterial) 02/17/2017  . Postoperative anemia 02/17/2017  . Essential hypertension 02/17/2017  . UTI (urinary tract infection) 02/17/2017  . Lumbar radiculopathy 02/04/2017  . Muscle weakness (generalized) 02/17/2012    Past Surgical History:  Procedure Laterality Date  . ABDOMINAL EXPOSURE N/A 02/04/2017   Procedure: ABDOMINAL EXPOSURE;  Surgeon: Rosetta Posner, MD;  Location: [place];  Service: Vascular;  Laterality: N/A;  . ANTERIOR LAT LUMBAR FUSION Right 02/04/2017   Procedure: RIGHT SIDED LUMBAR 3-4, LUMBAR 4-5 LATERAL INTERBODY FUSION WITH INSTRUMENTATION AND ALLOGRAFT;  Surgeon: Phylliss Bob, MD;  Location: [place];  Service: Orthopedics;  Laterality: Right;  LEFT OR RIGHT SIDED LUMBAR 2-3, LUMBAR 3-4 LATERAL INTERBODY FUSION WITH INSTRUMENTATION AND ALLOGRAFT   . ANTERIOR LUMBAR FUSION N/A 02/04/2017   Procedure: LUMBAR 5-SACRUM 1 ANTERIOR LUMBAR FUSION WITH INSTRUMENTATION AND ALLOGRAFT;  Surgeon: Phylliss Bob, MD;  Location: [place];  Service: Orthopedics;  Laterality: N/A;  LUMBAR 4-5, LUMBAR 5-SACRUM 1 ANTERIOR LUMBAR FUSION WITH INSTRUMENTATION AND ALLOGRAFT  . COLON SURGERY     hx colon ca  . COLONOSCOPY N/A 04/24/2014   Procedure: COLONOSCOPY;  Surgeon: Daneil Dolin, MD;  [facility];  Service: Endoscopy;  Laterality: N/A;  8:45 AM  . HERNIA REPAIR     bilateral  . POSTERIOR LUMBAR FUSION 4 LEVEL Bilateral 02/05/2017   Procedure: LUMBAR 2-3, LUMBAR 3-4, LUMBAR 4-5, LUMBAR 5-SACRUM 1 POSTERIOR SPINAL FUSION WITH INSTRUMENTATION AND ALLOGRAFT;  Surgeon: Phylliss Bob, MD;  Location: [place];  Service: Orthopedics;  Laterality: Bilateral;  LUMBAR 2-3, LUMBAR 3-4, LUMBAR 4-5, LUMBAR 5-SACRUM 1 POSTERIOR SPINAL FUSION WITH INSTRUMENTATION AND ALLOGRAFT       Home Medications    Prior to Admission medications   Medication Sig Start Date End Date Taking? Authorizing Provider  atorvastatin (LIPITOR) 40 MG tablet Take 40 mg by mouth at bedtime.   Yes [provider]  feeding supplement (BOOST HIGH PROTEIN) LIQD Take 1 Container by mouth 2 (two) times a week.   Yes [provider]  Iron-FA-B Cmp-C-Biot-Probiotic (FUSION PLUS) CAPS Take 1 capsule by mouth daily. 05/27/17  Yes [provider]  lisinopril (PRINIVIL,ZESTRIL) 20 MG tablet Take 20 mg by  mouth daily.   Yes [provider]  Multiple Vitamins-Iron (ONE DAILY MULTIVITAMIN/IRON) TABS Take 1 tablet by mouth daily with breakfast.   Yes [provider]  vitamin C (ASCORBIC ACID) 500 MG tablet Take 500 mg by mouth daily.   Yes [provider]  feeding supplement, ENSURE ENLIVE, (ENSURE ENLIVE) LIQD Take 237 mLs by mouth 2 (two) times daily between meals. Patient not taking: Reported on 06/16/2017 02/21/17   Mariel Aloe, MD    Family  History No family history on file.  Social History Social History  Substance Use Topics  . Smoking status: Never Smoker  . Smokeless tobacco: Never Used  . Alcohol use No     Allergies   No known allergies   Review of Systems Review of Systems  All other systems reviewed and are negative.    Physical Exam Updated Vital Signs BP (!) 113/59 (BP Location: Left Arm)   Pulse 96   Temp 100.1 F (37.8 C) (Oral)   Resp 18   Ht 6\' 2"  (1.88 m)   Wt 81.6 kg (180 lb)   SpO2 100%   BMI 23.11 kg/m   Physical Exam  Constitutional: He is oriented to person, place, and time. He appears well-developed and well-nourished.  HENT:  Head: Normocephalic and atraumatic.  Eyes: EOM are normal.  Neck: Normal range of motion.  Cardiovascular: Normal rate, regular rhythm, normal heart sounds and intact distal pulses.   Pulmonary/Chest: Effort normal and breath sounds normal. No respiratory distress.  Abdominal: Soft. He exhibits no distension. There is no tenderness.  Musculoskeletal: Normal range of motion.  Neurological: He is alert and oriented to person, place, and time.  Skin: Skin is warm and dry.  Psychiatric: He has a normal mood and affect. Judgment normal.  Nursing note and vitals reviewed.    ED Treatments / Results  Labs (all labs ordered are listed, but only abnormal results are displayed) Labs Reviewed  BASIC METABOLIC PANEL - Abnormal; Notable for the following:       Result Value   Glucose, Bld 134 (*)    All other components within normal limits  CBC - Abnormal; Notable for the following:    WBC 11.5 (*)    Hemoglobin 12.2 (*)    HCT 37.9 (*)    RDW 16.4 (*)    All other components within normal limits  HEPATIC FUNCTION PANEL - Abnormal; Notable for the following:    ALT 16 (*)    All other components within normal limits  CULTURE, BLOOD (ROUTINE X 2)  CULTURE, BLOOD (ROUTINE X 2)  URINE CULTURE  URINALYSIS, ROUTINE W REFLEX MICROSCOPIC  CBG MONITORING,  ED  I-STAT CG4 LACTIC ACID, ED  TYPE AND SCREEN    EKG  EKG Interpretation None       Radiology Dg Chest 2 View  Result Date: 06/16/2017 CLINICAL DATA:  Weakness and fever for 2 days EXAM: CHEST  2 VIEW COMPARISON:  02/18/2017 FINDINGS: Normal heart size. Lungs are under aerated with bibasilar atelectasis. No pneumothorax. No pleural effusion. IMPRESSION: Bibasilar atelectasis. Electronically Signed   By: Marybelle Killings M.D.   On: 06/16/2017 20:05    Procedures Procedures (including critical care time)  Medications Ordered in ED Medications  sodium chloride 0.9 % bolus 1,000 mL (not administered)     Initial Impression / Assessment and Plan / ED Course  I have reviewed the triage vital signs and the nursing notes.  Pertinent labs & imaging results that  were available during my care of the patient were reviewed by me and considered in my medical decision making (see chart for details).     8:52 PM Feels better at this time. Fever without a clear source. May be viral. Well appearing. Vitals normal. Reliable family. At home with spouse. WBC 11.5  Lactate   Normal. Urine and blood cultures obtained. Understands to return to the ER for new or worsening symptoms  Final Clinical Impressions(s) / ED Diagnoses   Final diagnoses:  None    New Prescriptions New Prescriptions   No medications on file     Jola Schmidt, MD 06/16/17 2100

## 2017-06-17 ENCOUNTER — Encounter (HOSPITAL_COMMUNITY): Payer: Self-pay | Admitting: *Deleted

## 2017-06-17 ENCOUNTER — Emergency Department (HOSPITAL_COMMUNITY)
Admission: EM | Admit: 2017-06-17 | Discharge: 2017-06-17 | Disposition: A | Discharge status: Home / Self Care | Payer: Medicare Other | Attending: Emergency Medicine | Admitting: Emergency Medicine

## 2017-06-17 DIAGNOSIS — R799 Abnormal finding of blood chemistry, unspecified: Secondary | ICD-10-CM | POA: Diagnosis present

## 2017-06-17 DIAGNOSIS — I10 Essential (primary) hypertension: Secondary | ICD-10-CM | POA: Insufficient documentation

## 2017-06-17 DIAGNOSIS — Z85038 Personal history of other malignant neoplasm of large intestine: Secondary | ICD-10-CM | POA: Diagnosis not present

## 2017-06-17 DIAGNOSIS — R899 Unspecified abnormal finding in specimens from other organs, systems and tissues: Secondary | ICD-10-CM

## 2017-06-17 LAB — BASIC METABOLIC PANEL WITH GFR
Anion gap: 8 (ref 5–15)
BUN: 16 mg/dL (ref 6–20)
CO2: 26 mmol/L (ref 22–32)
Calcium: 8.9 mg/dL (ref 8.9–10.3)
Chloride: 103 mmol/L (ref 101–111)
Creatinine, Ser: 1.14 mg/dL (ref 0.61–1.24)
GFR calc Af Amer: >60 mL/min (ref >60)
GFR calc non Af Amer: >60 mL/min (ref >60)
Glucose, Bld: 160 mg/dL — ABNORMAL HIGH (ref 65–99)
Potassium: 3.6 mmol/L (ref 3.5–5.1)
Sodium: 137 mmol/L (ref 135–145)

## 2017-06-17 LAB — BLOOD CULTURE ID PANEL (REFLEXED)

## 2017-06-17 LAB — CBC WITH DIFFERENTIAL/PLATELET
Basophils Absolute: 0 K/uL (ref 0.0–0.1)
Basophils Relative: 0 %
Eosinophils Absolute: 0.1 K/uL (ref 0.0–0.7)
Eosinophils Relative: 1 %
HCT: 34.5 % — ABNORMAL LOW (ref 39.0–52.0)
Hemoglobin: 11 g/dL — ABNORMAL LOW (ref 13.0–17.0)
Lymphocytes Relative: 10 %
Lymphs Abs: 1 K/uL (ref 0.7–4.0)
MCH: 27.3 pg (ref 26.0–34.0)
MCHC: 31.9 g/dL (ref 30.0–36.0)
MCV: 85.6 fL (ref 78.0–100.0)
Monocytes Absolute: 0.9 K/uL (ref 0.1–1.0)
Monocytes Relative: 9 %
Neutro Abs: 7.8 K/uL — ABNORMAL HIGH (ref 1.7–7.7)
Neutrophils Relative %: 80 %
Platelets: 227 K/uL (ref 150–400)
RBC: 4.03 MIL/uL — ABNORMAL LOW (ref 4.22–5.81)
RDW: 16.5 % — ABNORMAL HIGH (ref 11.5–15.5)
WBC: 9.8 K/uL (ref 4.0–10.5)

## 2017-06-17 NOTE — ED Triage Notes (Signed)
Pt states that he was told to return to er for follow up from gram positive cocci aerobic blood culture that was drawn yesterday, pt states that he is doing "pretty good" today,

## 2017-06-17 NOTE — ED Notes (Signed)
ED Provider at bedside. 

## 2017-06-17 NOTE — Discharge Instructions (Signed)
You were seen in the ED today with an abnormal lab. The positive blood culture was likely a contaminant from bacteria on the skin. We will call if any other organisms grow on today's lab work. Follow up with your PCP in the coming days.   Return to the ED immediately with any worsening weakness, fever, chills, or other concerning symptoms.

## 2017-06-17 NOTE — ED Notes (Signed)
ED Provider at bedside to discuss plan of care with the patient and his family

## 2017-06-17 NOTE — ED Notes (Signed)
Pt called with pos blood cultures  Gram pos cocci [facility] Staff  Talked to pt wife,  She said pt felt ok but had a low grade temp ,  Suggested to her to take pt to hospital for antibiotic tx,  She talked to husband,  They will go either tonight or in am for temp, stressed to family that if any got worse go to ed right away   s Rayann Jolley rn  2025   06/17/17

## 2017-06-17 NOTE — ED Provider Notes (Signed)
Emergency Department Provider Note   I have reviewed the triage vital signs and the nursing notes.   HISTORY  Chief Complaint Abnormal Lab   HPI Frank [name] is a 71 y.o. male with PMH of HLD, HTN, and colon cancer not currently on chemotherapy resents to the emergency department after being called to return for positive blood culture. Patient was seen in the emergency department yesterday for generalized weakness and fever. No clear source for infection was found that he was discharged with plan for fever control and supportive care. He was called today to inform him that one of his blood cultures grew bacteria and he should return to the emergency department.  The patient states that overall he has felt better today than yesterday but did have a fever this morning. No new symptoms such as cough, chest pain, shortness of breath, abdominal pain, nausea, vomiting, diarrhea. No medication changes. Denies rash. No radiation of symptoms.    Past Medical History:  Diagnosis Date  . Arthritis    left knee  . Cancer [facility])    colon cancer  . History of kidney stones   . Hypercholesteremia   . Hypertension   . Sleep apnea    uses cpap    Patient Active Problem List   Diagnosis Date Noted  . Hiccups 02/20/2017  . Acute lower UTI 02/17/2017  . AKI (acute kidney injury) ([place]) 02/17/2017  . Hypotension (arterial) 02/17/2017  . Postoperative anemia 02/17/2017  . Essential hypertension 02/17/2017  . UTI (urinary tract infection) 02/17/2017  . Lumbar radiculopathy 02/04/2017  . Muscle weakness (generalized) 02/17/2012    Past Surgical History:  Procedure Laterality Date  . ABDOMINAL EXPOSURE N/A 02/04/2017   Procedure: ABDOMINAL EXPOSURE;  Surgeon: Rosetta Posner, MD;  Location: [place];  Service: Vascular;  Laterality: N/A;  . ANTERIOR LAT LUMBAR FUSION Right 02/04/2017   Procedure: RIGHT SIDED LUMBAR 3-4, LUMBAR 4-5 LATERAL INTERBODY FUSION WITH INSTRUMENTATION AND ALLOGRAFT;   Surgeon: Phylliss Bob, MD;  Location: [place];  Service: Orthopedics;  Laterality: Right;  LEFT OR RIGHT SIDED LUMBAR 2-3, LUMBAR 3-4 LATERAL INTERBODY FUSION WITH INSTRUMENTATION AND ALLOGRAFT  . ANTERIOR LUMBAR FUSION N/A 02/04/2017   Procedure: LUMBAR 5-SACRUM 1 ANTERIOR LUMBAR FUSION WITH INSTRUMENTATION AND ALLOGRAFT;  Surgeon: Phylliss Bob, MD;  Location: [place];  Service: Orthopedics;  Laterality: N/A;  LUMBAR 4-5, LUMBAR 5-SACRUM 1 ANTERIOR LUMBAR FUSION WITH INSTRUMENTATION AND ALLOGRAFT  . COLON SURGERY     hx colon ca  . COLONOSCOPY N/A 04/24/2014   Procedure: COLONOSCOPY;  Surgeon: Daneil Dolin, MD;  [facility];  Service: Endoscopy;  Laterality: N/A;  8:45 AM  . HERNIA REPAIR     bilateral  . POSTERIOR LUMBAR FUSION 4 LEVEL Bilateral 02/05/2017   Procedure: LUMBAR 2-3, LUMBAR 3-4, LUMBAR 4-5, LUMBAR 5-SACRUM 1 POSTERIOR SPINAL FUSION WITH INSTRUMENTATION AND ALLOGRAFT;  Surgeon: Phylliss Bob, MD;  Location: [place];  Service: Orthopedics;  Laterality: Bilateral;  LUMBAR 2-3, LUMBAR 3-4, LUMBAR 4-5, LUMBAR 5-SACRUM 1 POSTERIOR SPINAL FUSION WITH INSTRUMENTATION AND ALLOGRAFT    Current Outpatient Rx  . Order #: [id number] Class: Historical Med  . Order #: [id number] Class: Historical Med  . Order #: [id number] Class: Historical Med  . Order #: [id number] Class: Historical Med  . Order #: [id number] Class: Historical Med  . Order #: [id number] Class: Historical Med  . Order #: [id number] Class: Print    Allergies No known allergies  No family history on file.  Social History Social History  Substance  Use Topics  . Smoking status: Never Smoker  . Smokeless tobacco: Never Used  . Alcohol use No    Review of Systems  Constitutional: No chills. Positive fever.  Eyes: No visual changes. ENT: No sore throat. Cardiovascular: Denies chest pain. Respiratory: Denies shortness of breath. Gastrointestinal: No abdominal pain.  No nausea, no vomiting.  No diarrhea.  No  constipation. Genitourinary: Negative for dysuria. Musculoskeletal: Negative for back pain. Skin: Negative for rash. Neurological: Negative for headaches, focal weakness or numbness.  10-point ROS otherwise negative.  ____________________________________________   PHYSICAL EXAM:  VITAL SIGNS: ED Triage Vitals  Enc Vitals Group     BP 06/17/17 2203 123/62     Pulse Rate 06/17/17 2203 80     Resp 06/17/17 2203 18     Temp 06/17/17 2203 98.1 F (36.7 C)     Temp Source 06/17/17 2203 Oral     SpO2 06/17/17 2203 100 %     Weight 06/17/17 2204 180 lb (81.6 kg)     Height 06/17/17 2204 6\' 2"  (1.88 m)     Pain Score 06/17/17 2203 0   Constitutional: Alert and oriented. Well appearing and in no acute distress. Eyes: Conjunctivae are normal. Head: Atraumatic. Nose: No congestion/rhinnorhea. Mouth/Throat: Mucous membranes are dry.  Neck: No stridor.   Cardiovascular: Normal rate, regular rhythm. Good peripheral circulation. Grossly normal heart sounds.   Respiratory: Normal respiratory effort.  No retractions. Lungs CTAB. Gastrointestinal: Soft and nontender. No distention.  Musculoskeletal: No lower extremity tenderness nor edema. No gross deformities of extremities. Neurologic:  Normal speech and language. No gross focal neurologic deficits are appreciated.  Skin:  Skin is warm, dry and intact. No rash noted.  ____________________________________________   LABS (all labs ordered are listed, but only abnormal results are displayed)  Labs Reviewed  BASIC METABOLIC PANEL - Abnormal; Notable for the following:       Result Value   Glucose, Bld 160 (*)    All other components within normal limits  CBC WITH DIFFERENTIAL/PLATELET - Abnormal; Notable for the following:    RBC 4.03 (*)    Hemoglobin 11.0 (*)    HCT 34.5 (*)    RDW 16.5 (*)    Neutro Abs 7.8 (*)    All other components within normal limits  CULTURE, BLOOD (ROUTINE X 2)  CULTURE, BLOOD (ROUTINE X 2)    ____________________________________________  RADIOLOGY  CXR from yesterday reviewed.  ____________________________________________   PROCEDURES  Procedure(s) performed:   Procedures  None ____________________________________________   INITIAL IMPRESSION / ASSESSMENT AND PLAN / ED COURSE  Pertinent labs & imaging results that were available during my care of the patient were reviewed by me and considered in my medical decision making (see chart for details).  Patient resents to the emergency department for evaluation of positive blood culture. Overall he is feeling better but did have fever this morning. He has slightly dry mucous membranes on exam. No new symptoms or findings on exam to suggest specific source for possible infection. The blood culture likely a contaminant which correlates well clinically. Plan for repeat labs, cultures, and reassess.  11:42 PM Lab work is normalized from yesterday. Clinically patient is feeling better. Suspect that positive blood culture is from contamination as opposed to acute bacteremia. Discussed continued supportive care at home. No oral antibiotics at discharge. Continue to follow repeat cultures today.   At this time, I do not feel there is any life-threatening condition present. I have reviewed and discussed all  results (EKG, imaging, lab, urine as appropriate), exam findings with patient. I have reviewed nursing notes and appropriate previous records.  I feel the patient is safe to be discharged home without further emergent workup. Discussed usual and customary return precautions. Patient and family (if present) verbalize understanding and are comfortable with this plan.  Patient will follow-up with their primary care provider. If they do not have a primary care provider, information for follow-up has been provided to them. All questions have been answered.  ____________________________________________  FINAL CLINICAL IMPRESSION(S) / ED  DIAGNOSES  Final diagnoses:  Abnormal laboratory test     MEDICATIONS GIVEN DURING THIS VISIT:  Medications - No data to display   NEW OUTPATIENT MEDICATIONS STARTED DURING THIS VISIT:  None   Note:  This document was prepared using Dragon voice recognition software and may include unintentional dictation errors.  Nanda Quinton, MD Emergency Medicine    Long, Wonda [facility], MD 06/17/17 [phone]

## 2017-06-17 NOTE — ED Notes (Signed)
CRITICAL VALUE ALERT  Critical Value:  Aerobic culture bottle shows Gram Positive Cocci  Date & Time Notied:  06/17/2017  Provider Notified: Evalee Jefferson PA  Orders Received/Actions taken: instructed to call pt and ask him to return for futher evaluation.  Pt did not answer, left message for pt to call back asap.

## 2017-06-18 LAB — CULTURE, BLOOD (ROUTINE X 2): Special Requests: ADEQUATE

## 2017-06-18 LAB — URINE CULTURE

## 2017-06-19 ENCOUNTER — Telehealth: Payer: Self-pay

## 2017-06-19 NOTE — Telephone Encounter (Signed)
Per Leeroy Cha Pharm D:  BC + likely contaminant  NO further treatment

## 2017-06-21 LAB — CULTURE, BLOOD (ROUTINE X 2)
Culture: NO GROWTH
Special Requests: ADEQUATE

## 2017-06-22 LAB — CULTURE, BLOOD (ROUTINE X 2)
Culture: NO GROWTH
Culture: NO GROWTH
Special Requests: ADEQUATE
Special Requests: ADEQUATE

## 2019-06-10 ENCOUNTER — Other Ambulatory Visit: Payer: Medicare Other

## 2019-06-10 DIAGNOSIS — Z20822 Contact with and (suspected) exposure to covid-19: Secondary | ICD-10-CM

## 2019-06-13 LAB — NOVEL CORONAVIRUS, NAA: SARS-CoV-2, NAA: NOT DETECTED

## 2019-06-28 ENCOUNTER — Telehealth: Payer: Self-pay | Admitting: *Deleted

## 2019-06-28 NOTE — Telephone Encounter (Signed)
Patient is calling to get COVID result- notified patient result is negative. Patient was not having symptoms- tested for screening only. Advised patient if he should have changes or concerns please contact PCP/retest

## 2021-02-09 ENCOUNTER — Ambulatory Visit
Admission: EM | Admit: 2021-02-09 | Discharge: 2021-02-09 | Disposition: A | Discharge status: Home / Self Care | Payer: Medicare Other | Attending: Physician Assistant | Admitting: Physician Assistant

## 2021-02-09 ENCOUNTER — Encounter: Payer: Self-pay | Admitting: Emergency Medicine

## 2021-02-09 ENCOUNTER — Other Ambulatory Visit: Payer: Self-pay

## 2021-02-09 DIAGNOSIS — T1592XA Foreign body on external eye, part unspecified, left eye, initial encounter: Secondary | ICD-10-CM | POA: Diagnosis not present

## 2021-02-09 MED ORDER — POLYMYXIN B-TRIMETHOPRIM 10000-0.1 UNIT/ML-% OP SOLN: 1.0000 [drp] | Freq: Four times a day (QID) | OPHTHALMIC | 0 refills | Status: AC

## 2021-02-09 NOTE — Discharge Instructions (Addendum)
FB removed. Treat with antibiotic drops for 4 days to be sure no post infection. FU with Opthalmology if needed.

## 2021-02-09 NOTE — ED Triage Notes (Signed)
Irritation, redness to LT eye since yesterday after mowing grass

## 2021-02-09 NOTE — ED Provider Notes (Signed)
[facility]    CSN: [id number] Arrival date & time: 02/09/21  0907      History   Chief Complaint Chief Complaint  Patient presents with  . Eye Problem    HPI Frank [name] is a 75 y.o. male.   Who was mowing yesterday and feels as though he got something in his left eye. His daughter who is a nurse rinsed it out yesterday, but it is still painful and feels like something is there. No redness or warmth. No changes in vision.      Past Medical History:  Diagnosis Date  . Arthritis    left knee  . Cancer [facility])    colon cancer  . History of kidney stones   . Hypercholesteremia   . Hypertension   . Sleep apnea    uses cpap    Patient Active Problem List   Diagnosis Date Noted  . Hiccups 02/20/2017  . Acute lower UTI 02/17/2017  . AKI (acute kidney injury) ([place]) 02/17/2017  . Hypotension (arterial) 02/17/2017  . Postoperative anemia 02/17/2017  . Essential hypertension 02/17/2017  . UTI (urinary tract infection) 02/17/2017  . Lumbar radiculopathy 02/04/2017  . Muscle weakness (generalized) 02/17/2012    Past Surgical History:  Procedure Laterality Date  . ABDOMINAL EXPOSURE N/A 02/04/2017   Procedure: ABDOMINAL EXPOSURE;  Surgeon: Rosetta Posner, MD;  Location: [place];  Service: Vascular;  Laterality: N/A;  . ANTERIOR LAT LUMBAR FUSION Right 02/04/2017   Procedure: RIGHT SIDED LUMBAR 3-4, LUMBAR 4-5 LATERAL INTERBODY FUSION WITH INSTRUMENTATION AND ALLOGRAFT;  Surgeon: Phylliss Bob, MD;  Location: [place];  Service: Orthopedics;  Laterality: Right;  LEFT OR RIGHT SIDED LUMBAR 2-3, LUMBAR 3-4 LATERAL INTERBODY FUSION WITH INSTRUMENTATION AND ALLOGRAFT  . ANTERIOR LUMBAR FUSION N/A 02/04/2017   Procedure: LUMBAR 5-SACRUM 1 ANTERIOR LUMBAR FUSION WITH INSTRUMENTATION AND ALLOGRAFT;  Surgeon: Phylliss Bob, MD;  Location: [place];  Service: Orthopedics;  Laterality: N/A;  LUMBAR 4-5, LUMBAR 5-SACRUM 1 ANTERIOR LUMBAR FUSION WITH INSTRUMENTATION AND ALLOGRAFT  .  COLON SURGERY     hx colon ca  . COLONOSCOPY N/A 04/24/2014   Procedure: COLONOSCOPY;  Surgeon: Daneil Dolin, MD;  [facility];  Service: Endoscopy;  Laterality: N/A;  8:45 AM  . HERNIA REPAIR     bilateral  . POSTERIOR LUMBAR FUSION 4 LEVEL Bilateral 02/05/2017   Procedure: LUMBAR 2-3, LUMBAR 3-4, LUMBAR 4-5, LUMBAR 5-SACRUM 1 POSTERIOR SPINAL FUSION WITH INSTRUMENTATION AND ALLOGRAFT;  Surgeon: Phylliss Bob, MD;  Location: [place];  Service: Orthopedics;  Laterality: Bilateral;  LUMBAR 2-3, LUMBAR 3-4, LUMBAR 4-5, LUMBAR 5-SACRUM 1 POSTERIOR SPINAL FUSION WITH INSTRUMENTATION AND ALLOGRAFT       Home Medications    Prior to Admission medications   Medication Sig Start Date End Date Taking? Authorizing Provider  trimethoprim-polymyxin b (POLYTRIM) ophthalmic solution Place 1 drop into the left eye every 6 (six) hours for 3 days. 02/09/21 02/12/21 Yes Madge Therrien, Vanessa Belspring, PA-C  atorvastatin (LIPITOR) 40 MG tablet Take 40 mg by mouth at bedtime.    [provider]  feeding supplement (BOOST HIGH PROTEIN) LIQD Take 1 Container by mouth 2 (two) times a week.    [provider]  feeding supplement, ENSURE ENLIVE, (ENSURE ENLIVE) LIQD Take 237 mLs by mouth 2 (two) times daily between meals. Patient not taking: Reported on 06/16/2017 02/21/17   Mariel Aloe, MD  Iron-FA-B Cmp-C-Biot-Probiotic (FUSION PLUS) CAPS Take 1 capsule by mouth daily. 05/27/17  [provider]  lisinopril (PRINIVIL,ZESTRIL) 20 MG tablet Take 20 mg by mouth daily.    [provider]  Multiple Vitamins-Iron (ONE DAILY MULTIVITAMIN/IRON) TABS Take 1 tablet by mouth daily with breakfast.    [provider]  vitamin C (ASCORBIC ACID) 500 MG tablet Take 500 mg by mouth daily.    [provider]    Family History No family history on file.  Social History Social History   Tobacco Use  . Smoking status: Never Smoker  . Smokeless tobacco: Never Used  Substance  Use Topics  . Alcohol use: No  . Drug use: No     Allergies   No known allergies   Review of Systems Review of Systems  Eyes: Positive for pain. Negative for photophobia, discharge, redness, itching and visual disturbance.  All other systems reviewed and are negative.    Physical Exam Triage Vital Signs ED Triage Vitals [02/09/21 0917]  Enc Vitals Group     BP 140/73     Pulse Rate 61     Resp 19     Temp 97.9 F (36.6 C)     Temp Source Oral     SpO2 98 %     Weight      Height      Head Circumference      Peak Flow      Pain Score 0     Pain Loc      Pain Edu?      Excl. in [place]?    No data found.  Updated Vital Signs BP 140/73 (BP Location: Right Arm)   Pulse 61   Temp 97.9 F (36.6 C) (Oral)   Resp 19   SpO2 98%   Visual Acuity Right Eye Distance:   Left Eye Distance:   Bilateral Distance:    Right Eye Near:   Left Eye Near:    Bilateral Near:     Physical Exam Vitals and nursing note reviewed.  Constitutional:      Appearance: Normal appearance.  HENT:     Head: Normocephalic and atraumatic.  Eyes:     General:        Right eye: No discharge.        Left eye: No discharge.     Extraocular Movements: Extraocular movements intact.     Conjunctiva/sclera: Conjunctivae normal.     Pupils: Pupils are equal, round, and reactive to light.     Comments: Left eye with small black FB in eyelid-removed without complication and immediate relief  Skin:    General: Skin is warm and dry.  Neurological:     General: No focal deficit present.     Mental Status: He is alert.  Psychiatric:        Mood and Affect: Mood normal.        Behavior: Behavior normal.      UC Treatments / Results  Labs (all labs ordered are listed, but only abnormal results are displayed) Labs Reviewed - No data to display  EKG   Radiology No results found.  Procedures Procedures (including critical care time)  Medications Ordered in UC Medications - No data to  display  Initial Impression / Assessment and Plan / UC Course  I have reviewed the triage vital signs and the nursing notes.  Pertinent labs & imaging results that were available during my care of the patient were reviewed by me and considered in my medical decision making (see chart for details).  FB removed without complication. Post antibiotics given. FU with opthalmology if needed.  Final Clinical Impressions(s) / UC Diagnoses   Final diagnoses:  FB eye, left, initial encounter     Discharge Instructions     FB removed. Treat with antibiotic drops for 4 days to be sure no post infection. FU with Opthalmology if needed.    ED Prescriptions    Medication Sig Dispense Auth. Provider   trimethoprim-polymyxin b (POLYTRIM) ophthalmic solution Place 1 drop into the left eye every 6 (six) hours for 3 days. 10 mL Bjorn Pippin, [place]     PDMP not reviewed this encounter.   Bjorn Pippin, PA-C 02/09/21 1018

## 2022-06-02 ENCOUNTER — Encounter (HOSPITAL_COMMUNITY)
Admission: RE | Admit: 2022-06-02 | Discharge: 2022-06-02 | Disposition: A | Discharge status: Home / Self Care | Payer: Medicare Other | Source: Ambulatory Visit | Attending: Ophthalmology | Admitting: Ophthalmology

## 2022-06-03 NOTE — H&P (Signed)
Surgical History & Physical  Patient Name: Frank [name] DOB: 1946-02-14  Surgery: Cataract extraction with intraocular lens implant phacoemulsification; Right Eye  Surgeon: Baruch Goldmann MD Surgery Date:  06-06-22 Pre-Op Date:  06-02-22  HPI: A 32 Yr. old male patient 1. The patient is returning for a glaucoma 1 months follow-up of the right eye. Since the last visit, the affected area has no changes noted and is doing well. The patient's vision is stable. The patient experiences no diplopia. Patient is following medication instructions. The patient experiences no flashes, floater, shadow, curtain or veil. Patient is also here for a cataract eval. He was noted to have occludable/narrow angles on previous exam. Has some trouble with small print. This is negatively affecting the patient's quality of life and the patient is unable to function adequately in life with the current level of vision. Using Latanoprost QHS OD. HPI was performed by Baruch Goldmann .  Medical History: Cataracts [place], Hypertensive retinopathy High Blood Pressure LDL  Review of Systems Negative Allergic/Immunologic Negative Cardiovascular Negative Constitutional Negative Ear, Nose, Mouth & Throat Negative Endocrine Negative Eyes Negative Gastrointestinal Negative Genitourinary Negative Hemotologic/Lymphatic Negative Integumentary Negative Musculoskeletal Negative Neurological Negative Psychiatry Negative Respiratory  Social   Never smoked   Medication Latanoprost,  Lipitor, Atorvastatin,   Sx/Procedures  Hernia Sx,   Drug Allergies   NKDA  History & Physical: [place]: Cataract & Glaucoma; right eye NECK: supple without bruits LUNGS: lungs clear to auscultation CV: regular rate and rhythm Abdomen: soft and non-tender  Impression & Plan: Assessment: 1.  COMBINED FORMS AGE RELATED CATARACT; Both Eyes (H25.813) 2.  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  BLEPHARITIS;  Right Upper Lid, Right Lower Lid, Left Upper Lid, Left Lower Lid (H01.001, H01.002,H01.004,H01.005) 4.  NUCLEAR SCLEROSIS AGE RELATED; Both Eyes (H25.13) 5.  DERMATOCHALASIS, no surgery; Right Upper Lid, Left Upper Lid (H02.831, H02.834) 6.  [place]; Left Eye (H11.152) 7.  ARCUS SENILIS; Both Eyes (H18.413)  Plan: 1.  Cataract accounts for the patient's decreased vision. This visual impairment is not correctable with a tolerable change in glasses or contact lenses. Cataract surgery with an implantation of a new lens should significantly improve the visual and functional status of the patient. Cataract surgery also address his anatomic narrow angle and chronic angle closure glaucoma to help relieve increased IOP and minimize risk of acute glaucoma. Discussed all risks, benefits, alternatives, and potential complications. Discussed the procedures and recovery. Patient desires to have surgery. A-scan ordered and performed today for intra-ocular lens calculations. The surgery will be performed in order to improve vision for driving, reading, and for eye examinations. Recommend phacoemulsification with intra-ocular lens. Recommend Dextenza for post-operative pain and inflammation. Right Eye worse - first.. Dilates well - shugarcaine by protocol. Recommend iAccess right eye only.  2.  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 Thin corneas - increased risk for glaucoma damage.  OCT rNFL shows thinning OU 4/23 HVF completely unreliable OU. IOPs improved after starting treatment, but narrow angle persists. Recommend phaco PCIOL to relieve angle restriction and improve IOP. Recommend iAccess goniotomy right eyes at the time of cataract surgery to improve compliance and help lower eye pressure.  Continue Latanoprost 1 drop right eye at bedtime. Detailed discussion about glaucoma today including importance of maintaining good follow up and following treatment plan,  and the possibility of irreversible blindness as part of this disease process.  3.  Recommend regular lid cleaning.  4.   5.  Asymptomatic, recommend observation for now. Findings, prognosis and treatment options reviewed.  6.  Observe; Artificial tears as needed for irritation.  7.  Discussed significance of finding Answered patient questions about finding

## 2022-06-06 ENCOUNTER — Ambulatory Visit (HOSPITAL_COMMUNITY): Payer: Medicare Other | Admitting: Anesthesiology

## 2022-06-06 ENCOUNTER — Ambulatory Visit (HOSPITAL_BASED_OUTPATIENT_CLINIC_OR_DEPARTMENT_OTHER): Payer: Medicare Other | Admitting: Anesthesiology

## 2022-06-06 ENCOUNTER — Ambulatory Visit (HOSPITAL_COMMUNITY)
Admission: RE | Admit: 2022-06-06 | Discharge: 2022-06-06 | Disposition: A | Discharge status: Home / Self Care | Payer: Medicare Other | Attending: Ophthalmology | Admitting: Ophthalmology

## 2022-06-06 ENCOUNTER — Encounter (HOSPITAL_COMMUNITY): Payer: Self-pay | Admitting: Ophthalmology

## 2022-06-06 ENCOUNTER — Encounter (HOSPITAL_COMMUNITY)
Admission: RE | Disposition: A | Discharge status: Home / Self Care | Payer: Self-pay | Source: Home / Self Care | Attending: Ophthalmology

## 2022-06-06 DIAGNOSIS — H25811 Combined forms of age-related cataract, right eye: Secondary | ICD-10-CM | POA: Diagnosis present

## 2022-06-06 DIAGNOSIS — H01005 Unspecified blepharitis left lower eyelid: Secondary | ICD-10-CM | POA: Insufficient documentation

## 2022-06-06 DIAGNOSIS — I10 Essential (primary) hypertension: Secondary | ICD-10-CM | POA: Insufficient documentation

## 2022-06-06 DIAGNOSIS — H18413 Arcus senilis, bilateral: Secondary | ICD-10-CM | POA: Diagnosis not present

## 2022-06-06 DIAGNOSIS — G4733 Obstructive sleep apnea (adult) (pediatric): Secondary | ICD-10-CM

## 2022-06-06 DIAGNOSIS — H02834 Dermatochalasis of left upper eyelid: Secondary | ICD-10-CM | POA: Diagnosis not present

## 2022-06-06 DIAGNOSIS — H01002 Unspecified blepharitis right lower eyelid: Secondary | ICD-10-CM | POA: Diagnosis not present

## 2022-06-06 DIAGNOSIS — H2511 Age-related nuclear cataract, right eye: Secondary | ICD-10-CM

## 2022-06-06 DIAGNOSIS — H01001 Unspecified blepharitis right upper eyelid: Secondary | ICD-10-CM | POA: Insufficient documentation

## 2022-06-06 DIAGNOSIS — H259 Unspecified age-related cataract: Secondary | ICD-10-CM

## 2022-06-06 DIAGNOSIS — H402211 Chronic angle-closure glaucoma, right eye, mild stage: Secondary | ICD-10-CM | POA: Insufficient documentation

## 2022-06-06 DIAGNOSIS — G4730 Sleep apnea, unspecified: Secondary | ICD-10-CM | POA: Diagnosis not present

## 2022-06-06 DIAGNOSIS — H269 Unspecified cataract: Secondary | ICD-10-CM

## 2022-06-06 DIAGNOSIS — H01004 Unspecified blepharitis left upper eyelid: Secondary | ICD-10-CM | POA: Insufficient documentation

## 2022-06-06 DIAGNOSIS — H2513 Age-related nuclear cataract, bilateral: Secondary | ICD-10-CM | POA: Insufficient documentation

## 2022-06-06 DIAGNOSIS — H25813 Combined forms of age-related cataract, bilateral: Secondary | ICD-10-CM | POA: Diagnosis not present

## 2022-06-06 DIAGNOSIS — H02831 Dermatochalasis of right upper eyelid: Secondary | ICD-10-CM | POA: Diagnosis not present

## 2022-06-06 DIAGNOSIS — H11152 Pinguecula, left eye: Secondary | ICD-10-CM | POA: Diagnosis not present

## 2022-06-06 HISTORY — PX: CATARACT EXTRACTION W/PHACO: SHX586

## 2022-06-06 SURGERY — PHACOEMULSIFICATION, CATARACT, WITH IOL INSERTION
Anesthesia: Monitor Anesthesia Care | Site: Eye | Laterality: Right

## 2022-06-06 MED ORDER — SODIUM HYALURONATE 23MG/ML IO SOSY
PREFILLED_SYRINGE | INTRAOCULAR | Status: DC | PRN
  Administered 2022-06-06: 0.6 mL via INTRAOCULAR

## 2022-06-06 MED ORDER — LIDOCAINE HCL (PF) 1 % IJ SOLN
INTRAOCULAR | Status: DC | PRN
  Administered 2022-06-06: 1 mL via OPHTHALMIC

## 2022-06-06 MED ORDER — EPINEPHRINE PF 1 MG/ML IJ SOLN
INTRAMUSCULAR | Status: AC
  Filled 2022-06-06: qty 2

## 2022-06-06 MED ORDER — SODIUM CHLORIDE 0.9% FLUSH
INTRAVENOUS | Status: DC | PRN
  Administered 2022-06-06: 5 mL via INTRAVENOUS

## 2022-06-06 MED ORDER — EPINEPHRINE PF 1 MG/ML IJ SOLN
INTRAOCULAR | Status: DC | PRN
  Administered 2022-06-06: 500 mL

## 2022-06-06 MED ORDER — TETRACAINE HCL 0.5 % OP SOLN
1.0000 [drp] | OPHTHALMIC | Status: AC | PRN
  Administered 2022-06-06 (×3): 1 [drp] via OPHTHALMIC

## 2022-06-06 MED ORDER — STERILE WATER FOR IRRIGATION IR SOLN
Status: DC | PRN
  Administered 2022-06-06: 250 mL

## 2022-06-06 MED ORDER — PHENYLEPHRINE HCL 2.5 % OP SOLN
1.0000 [drp] | OPHTHALMIC | Status: AC | PRN
  Administered 2022-06-06 (×3): 1 [drp] via OPHTHALMIC

## 2022-06-06 MED ORDER — LIDOCAINE HCL 3.5 % OP GEL
1.0000 | Freq: Once | OPHTHALMIC | Status: AC
  Administered 2022-06-06: 1 via OPHTHALMIC

## 2022-06-06 MED ORDER — MIDAZOLAM HCL 2 MG/2ML IJ SOLN
INTRAMUSCULAR | Status: DC | PRN
  Administered 2022-06-06: 1 mg via INTRAVENOUS

## 2022-06-06 MED ORDER — TROPICAMIDE 1 % OP SOLN
1.0000 [drp] | OPHTHALMIC | Status: AC | PRN
  Administered 2022-06-06 (×3): 1 [drp] via OPHTHALMIC

## 2022-06-06 MED ORDER — BSS IO SOLN
INTRAOCULAR | Status: DC | PRN
  Administered 2022-06-06: 15 mL via INTRAOCULAR

## 2022-06-06 MED ORDER — MIDAZOLAM HCL 2 MG/2ML IJ SOLN
INTRAMUSCULAR | Status: AC
  Filled 2022-06-06: qty 2

## 2022-06-06 MED ORDER — NEOMYCIN-POLYMYXIN-DEXAMETH 3.5-10000-0.1 OP SUSP
OPHTHALMIC | Status: DC | PRN
  Administered 2022-06-06: 1 [drp] via OPHTHALMIC

## 2022-06-06 MED ORDER — POVIDONE-IODINE 5 % OP SOLN
OPHTHALMIC | Status: DC | PRN
  Administered 2022-06-06: 1 via OPHTHALMIC

## 2022-06-06 MED ORDER — SODIUM HYALURONATE 10 MG/ML IO SOLUTION
PREFILLED_SYRINGE | INTRAOCULAR | Status: DC | PRN
  Administered 2022-06-06: 0.85 mL via INTRAOCULAR

## 2022-06-06 SURGICAL SUPPLY — 18 items
CATARACT SUITE SIGHTPATH (MISCELLANEOUS) ×2 IMPLANT
CLIP IPRISM (KITS) ×1 IMPLANT
CLOTH BEACON ORANGE TIMEOUT ST (SAFETY) ×2 IMPLANT
EYE SHIELD UNIVERSAL CLEAR (GAUZE/BANDAGES/DRESSINGS) ×1 IMPLANT
FEE CATARACT SUITE SIGHTPATH (MISCELLANEOUS) ×1 IMPLANT
GLOVE BIOGEL PI IND STRL 7.0 (GLOVE) ×2 IMPLANT
GLOVE BIOGEL PI INDICATOR 7.0 (GLOVE) ×2
LENS IOL RAYNER 20.0 (Intraocular Lens) ×2 IMPLANT
LENS IOL RAYONE EMV 20.0 (Intraocular Lens) IMPLANT
NDL HYPO 18GX1.5 BLUNT FILL (NEEDLE) ×1 IMPLANT
NEEDLE HYPO 18GX1.5 BLUNT FILL (NEEDLE) ×2 IMPLANT
PAD ARMBOARD 7.5X6 YLW CONV (MISCELLANEOUS) ×2 IMPLANT
RING MALYGIN 7.0 (MISCELLANEOUS) IMPLANT
SYR TB 1ML LL NO SAFETY (SYRINGE) ×2 IMPLANT
TAPE SURG TRANSPORE 1 IN (GAUZE/BANDAGES/DRESSINGS) IMPLANT
TAPE SURGICAL TRANSPORE 1 IN (GAUZE/BANDAGES/DRESSINGS) ×2
TREPHINE GLAUKO IACCESS TRBCLR (OPHTHALMIC) ×1 IMPLANT
WATER STERILE IRR 250ML POUR (IV SOLUTION) ×2 IMPLANT

## 2022-06-06 NOTE — Anesthesia Procedure Notes (Signed)
Procedure Name: MAC Date/Time: 06/06/2022 12:27 PM  Performed by: Orlie Dakin, CRNAPre-anesthesia Checklist: Emergency Drugs available, Patient identified, Suction available and Patient being monitored Patient Re-evaluated:Patient Re-evaluated prior to induction Oxygen Delivery Method: Nasal cannula Placement Confirmation: positive ETCO2

## 2022-06-06 NOTE — Discharge Instructions (Signed)
Please discharge patient when stable, will follow up today with Dr. Fillmore Bynum at the [facility] [place] office immediately following discharge.  Leave shield in place until visit.  All paperwork with discharge instructions will be given at the office.  [facility] Address:  [address]  , [place] [address]  

## 2022-06-06 NOTE — Transfer of Care (Signed)
Immediate Anesthesia Transfer of Care Note  Patient: Frank [name]  Procedure(s) Performed: CATARACT EXTRACTION PHACO AND INTRAOCULAR LENS PLACEMENT (IOC) (Right: Eye) Goniotomy (Right: Eye)  Patient Location: Short Stay  Anesthesia Type:MAC  Level of Consciousness: awake, alert  and oriented  Airway & Oxygen Therapy: Patient Spontanous Breathing  Post-op Assessment: Report given to RN and Post -op Vital signs reviewed and stable  Post vital signs: Reviewed and stable  Last Vitals:  Vitals Value Taken Time  BP    Temp    Pulse    Resp    SpO2      Last Pain:  Vitals:   06/06/22 1149  PainSc: 0-No pain         Complications: No notable events documented.

## 2022-06-06 NOTE — Anesthesia Postprocedure Evaluation (Signed)
Anesthesia Post Note  Patient: Frank [name]  Procedure(s) Performed: CATARACT EXTRACTION PHACO AND INTRAOCULAR LENS PLACEMENT (IOC) (Right: Eye) Goniotomy (Right: Eye)  Patient location during evaluation: Phase II Anesthesia Type: MAC Level of consciousness: awake and alert and oriented Pain management: pain level controlled Vital Signs Assessment: post-procedure vital signs reviewed and stable Respiratory status: spontaneous breathing, nonlabored ventilation and respiratory function stable Cardiovascular status: stable and blood pressure returned to baseline Postop Assessment: no apparent nausea or vomiting Anesthetic complications: no   No notable events documented.   Last Vitals:  Vitals:   06/06/22 1149 06/06/22 1250  BP: 130/82 133/78  Pulse: (!) 55 (!) 55  Resp: 15 16  Temp: 36.7 C (!) 36.4 C  SpO2: 99% 100%    Last Pain:  Vitals:   06/06/22 1250  TempSrc: Oral  PainSc: 0-No pain                 Lloyd Ayo C Elanah Osmanovic

## 2022-06-06 NOTE — Op Note (Addendum)
Date of procedure: 06/06/22  Pre-operative diagnosis:  1) Visually significant age-related cataract, Right Eye (H25.811) 2) Chronic angle closure glaucoma, mild Stage, Right Eye  Post-operative diagnosis: 1) Visually significant age-related cataract, Right Eye 2) Chronic angle closure glaucoma, mild Stage, Right Eye  Procedure:  1) Removal of cataract via phacoemulsification and insertion of intra-ocular lens Rayner RAO200E +20.0D into the capsular bag of the Right Eye 2) Goniotomy treatment of nasal trabecular meshwork with iAccess, Right Eye  Attending surgeon: Gerda Diss. Arriona Prest, MD, MA  Anesthesia: MAC, Topical Akten  Complications: None  Estimated Blood Loss: <73m (minimal)  Specimens: None  Implants: As above  Indications:  Visually significant age-related cataract, Right Eye; Glaucoma, Right Eye  Procedure:  The patient was seen and identified in the pre-operative area. The operative eye was identified and dilated.  The operative eye was marked.  Topical anesthesia was administered to the operative eye.     The patient was then to the operative suite and placed in the supine position.  A timeout was performed confirming the patient, procedure to be performed, and all other relevant information.   The patient's face was prepped and draped in the usual fashion for intra-ocular surgery.  A lid speculum was placed into the operative eye and the surgical microscope moved into place and focused. A superotemporal paracentesis was created using a 20 gauge paracentesis blade.  Shugarcaine was injected into the anterior chamber.  Viscoelastic was injected into the anterior chamber.  A temporal clear-corneal main wound incision was created using a 2.433mmicrokeratome.  A continuous curvilinear capsulorrhexis was initiated using an irrigating cystitome and completed using capsulorrhexis forceps.  Hydrodissection and hydrodeliniation were performed.  Viscoelastic was injected into the anterior  chamber.  A phacoemulsification handpiece and a chopper as a second instrument were used to remove the nucleus and epinucleus. The irrigation/aspiration handpiece was used to remove any remaining cortical material.   The capsular bag was reinflated with viscoelastic, checked, and found to be intact.  The intraocular lens was inserted into the capsular bag and dialed into place using a Kuglen hook.    The patient's head was repositioned.  The iAccess was used to extensively treat the nasal trabecular meshwork for 90 degrees for a total of 6 goniotomies under gonioscopy.  The patient's head was returned to neutral.  The irrigation/aspiration handpiece was used to remove any remaining viscoelastic.  The clear corneal wound and paracentesis wounds were then hydrated and checked with Weck-Cels to be watertight.  The lid-speculum was removed.  The drape was removed.  The patient's face was cleaned with a wet and dry 4x4.  Maxitrol was instilled in the eye before a clear shield was taped over the eye. The patient was taken to the post-operative care unit in good condition, having tolerated the procedure well.  Post-Op Instructions: [facility] and will receive all other orders and instructions.

## 2022-06-06 NOTE — Anesthesia Preprocedure Evaluation (Signed)
Anesthesia Evaluation  Patient identified by MRN, date of birth, ID band Patient awake    Reviewed: Allergy & Precautions, NPO status , Patient's Chart, lab work & pertinent test results  Airway Mallampati: III  TM Distance: >3 FB Neck ROM: Full    Dental  (+) Dental Advisory Given, Caps   Pulmonary sleep apnea and Continuous Positive Airway Pressure Ventilation ,    Pulmonary exam normal breath sounds clear to auscultation       Cardiovascular Exercise Tolerance: Good hypertension, Pt. on medications Normal cardiovascular exam Rhythm:Regular Rate:Normal     Neuro/Psych  Neuromuscular disease negative psych ROS   GI/Hepatic negative GI ROS, Neg liver ROS, Colon cancer   Endo/Other  negative endocrine ROS  Renal/GU Renal disease (stones)  negative genitourinary   Musculoskeletal  (+) Arthritis , Osteoarthritis,  Lumbar fusion   Abdominal   Peds negative pediatric ROS (+)  Hematology  (+) Blood dyscrasia, anemia ,   Anesthesia Other Findings   Reproductive/Obstetrics negative OB ROS                             Anesthesia Physical Anesthesia Plan  ASA: 2  Anesthesia Plan: MAC   Post-op Pain Management: Minimal or no pain anticipated   Induction: Intravenous  PONV Risk Score and Plan:   Airway Management Planned: Nasal Cannula and Natural Airway  Additional Equipment:   Intra-op Plan:   Post-operative Plan:   Informed Consent: I have reviewed the patients History and Physical, chart, labs and discussed the procedure including the risks, benefits and alternatives for the proposed anesthesia with the patient or authorized representative who has indicated his/her understanding and acceptance.     Dental advisory given  Plan Discussed with: CRNA and Surgeon  Anesthesia Plan Comments:         Anesthesia Quick Evaluation

## 2022-06-06 NOTE — Interval H&P Note (Signed)
History and Physical Interval Note:  06/06/2022 12:21 PM  Frank [name]  has presented today for surgery, with the diagnosis of combined forms age related cataract; right glaucoma; right.  The various methods of treatment have been discussed with the patient and family. After consideration of risks, benefits and other options for treatment, the patient has consented to  Procedure(s): CATARACT EXTRACTION PHACO AND INTRAOCULAR LENS PLACEMENT (IOC) (Right) Goniotomy (Right) as a surgical intervention.  The patient's history has been reviewed, patient examined, no change in status, stable for surgery.  I have reviewed the patient's chart and labs.  Questions were answered to the patient's satisfaction.     Baruch Goldmann

## 2022-06-09 ENCOUNTER — Encounter (HOSPITAL_COMMUNITY): Payer: Self-pay | Admitting: Ophthalmology

## 2022-06-16 ENCOUNTER — Encounter (HOSPITAL_COMMUNITY)
Admission: RE | Admit: 2022-06-16 | Discharge: 2022-06-16 | Disposition: A | Discharge status: Home / Self Care | Payer: Medicare Other | Source: Ambulatory Visit | Attending: Ophthalmology | Admitting: Ophthalmology

## 2022-06-16 NOTE — H&P (Signed)
Surgical History & Physical  Patient Name: Frank [name] DOB: Mar 26, 1946  Surgery: Cataract extraction with intraocular lens implant phacoemulsification; Left Eye  Surgeon: Baruch Goldmann MD Surgery Date:  06-20-22 Pre-Op Date:  06-12-22  HPI: A 8 Yr. old male patient 1. The patient is returning after cataract surgery. The right eye is affected. Status post cataract surgery, which began 6 days ago: Since the last visit, the affected area is doing well. The patient's vision is improved. Patient is following medication instructions. 2. 2. The patient is returning for a cataract follow-up of the left eye. The complaint is associated with difficulty reading small print on medicine bottle/labels and writing checks or filling out forms. This is negatively affecting the patient's quality of life and the patient is unable to function adequately in life with the current level of vision. HPI was performed by Baruch Goldmann .  Medical History: Cataracts [place], Hypertensive retinopathy High Blood Pressure LDL  Review of Systems Negative Allergic/Immunologic Negative Cardiovascular Negative Constitutional Negative Ear, Nose, Mouth & Throat Negative Endocrine Negative Eyes Negative Gastrointestinal Negative Genitourinary Negative Hemotologic/Lymphatic Negative Integumentary Negative Musculoskeletal Negative Neurological Negative Psychiatry Negative Respiratory  Social   Never smoked   Medication Latanoprost, Prednisolone-Moxifloxacin-Bromfenac,  Lipitor, Atorvastatin,   Sx/Procedures Phaco c IOL OD with iAccess goniotomy,  Hernia Sx,   Drug Allergies   NKDA  History & Physical: Heent: Cataract, let eye NECK: supple without bruits LUNGS: lungs clear to auscultation CV: regular rate and rhythm Abdomen: soft and non-tender Impression & Plan: Assessment: 1.  CATARACT EXTRACTION STATUS; Left Eye (Z98.42) 2.  COMBINED FORMS AGE RELATED CATARACT; Left Eye (H25.812) 3.   Myopia ; Right Eye (H52.11)  Plan: 1.  1 week after cataract surgery. Doing well with improved vision and normal eye pressure. Call with any problems or concerns. Continue Pred-Moxi-Brom 2x/day for 3 more weeks.  2.  Cataract accounts for the patient's decreased vision. This visual impairment is not correctable with a tolerable change in glasses or contact lenses. Cataract surgery with an implantation of a new lens should significantly improve the visual and functional status of the patient. Discussed all risks, benefits, alternatives, and potential complications. Discussed the procedures and recovery. Patient desires to have surgery. A-scan ordered and performed today for intra-ocular lens calculations. The surgery will be performed in order to improve vision for driving, reading, and for eye examinations. Recommend phacoemulsification with intra-ocular lens. Recommend Dextenza for post-operative pain and inflammation. Left Eye. Surgery required to correct imbalance of vision. Dilates well - shugarcaine by protocol.  3.

## 2022-06-18 NOTE — Pre-Procedure Instructions (Signed)
Attempted pre-op phone call. Left VM on [phone] for him to call back.

## 2022-06-19 ENCOUNTER — Other Ambulatory Visit: Payer: Self-pay

## 2022-06-19 ENCOUNTER — Encounter (HOSPITAL_COMMUNITY): Payer: Self-pay

## 2022-06-20 ENCOUNTER — Ambulatory Visit (HOSPITAL_COMMUNITY)
Admission: RE | Admit: 2022-06-20 | Discharge: 2022-06-20 | Disposition: A | Discharge status: Home / Self Care | Payer: Medicare Other | Attending: Ophthalmology | Admitting: Ophthalmology

## 2022-06-20 ENCOUNTER — Ambulatory Visit (HOSPITAL_COMMUNITY): Payer: Medicare Other | Admitting: Certified Registered Nurse Anesthetist

## 2022-06-20 ENCOUNTER — Encounter (HOSPITAL_COMMUNITY)
Admission: RE | Disposition: A | Discharge status: Home / Self Care | Payer: Self-pay | Source: Home / Self Care | Attending: Ophthalmology

## 2022-06-20 ENCOUNTER — Ambulatory Visit (HOSPITAL_BASED_OUTPATIENT_CLINIC_OR_DEPARTMENT_OTHER): Payer: Medicare Other | Admitting: Certified Registered Nurse Anesthetist

## 2022-06-20 ENCOUNTER — Encounter (HOSPITAL_COMMUNITY): Payer: Self-pay | Admitting: Ophthalmology

## 2022-06-20 DIAGNOSIS — H25812 Combined forms of age-related cataract, left eye: Secondary | ICD-10-CM | POA: Diagnosis present

## 2022-06-20 DIAGNOSIS — G709 Myoneural disorder, unspecified: Secondary | ICD-10-CM | POA: Diagnosis not present

## 2022-06-20 DIAGNOSIS — I10 Essential (primary) hypertension: Secondary | ICD-10-CM | POA: Insufficient documentation

## 2022-06-20 DIAGNOSIS — D649 Anemia, unspecified: Secondary | ICD-10-CM | POA: Diagnosis not present

## 2022-06-20 HISTORY — PX: CATARACT EXTRACTION W/PHACO: SHX586

## 2022-06-20 SURGERY — PHACOEMULSIFICATION, CATARACT, WITH IOL INSERTION
Anesthesia: Monitor Anesthesia Care | Site: Eye | Laterality: Left

## 2022-06-20 MED ORDER — SODIUM HYALURONATE 10 MG/ML IO SOLUTION
PREFILLED_SYRINGE | INTRAOCULAR | Status: DC | PRN
  Administered 2022-06-20: 0.85 mL via INTRAOCULAR

## 2022-06-20 MED ORDER — MIDAZOLAM HCL 2 MG/2ML IJ SOLN
INTRAMUSCULAR | Status: AC
  Filled 2022-06-20: qty 2

## 2022-06-20 MED ORDER — LIDOCAINE HCL (PF) 1 % IJ SOLN
INTRAOCULAR | Status: DC | PRN
  Administered 2022-06-20: 1 mL via OPHTHALMIC

## 2022-06-20 MED ORDER — TROPICAMIDE 1 % OP SOLN
1.0000 [drp] | OPHTHALMIC | Status: AC | PRN
  Administered 2022-06-20 (×3): 1 [drp] via OPHTHALMIC

## 2022-06-20 MED ORDER — POVIDONE-IODINE 5 % OP SOLN
OPHTHALMIC | Status: DC | PRN
  Administered 2022-06-20: 1 via OPHTHALMIC

## 2022-06-20 MED ORDER — MIDAZOLAM HCL 5 MG/5ML IJ SOLN
INTRAMUSCULAR | Status: DC | PRN
  Administered 2022-06-20: 1 mg via INTRAVENOUS

## 2022-06-20 MED ORDER — SODIUM CHLORIDE 0.9% FLUSH
INTRAVENOUS | Status: DC | PRN
  Administered 2022-06-20: 5 mL via INTRAVENOUS

## 2022-06-20 MED ORDER — PHENYLEPHRINE HCL 2.5 % OP SOLN
1.0000 [drp] | OPHTHALMIC | Status: AC | PRN
  Administered 2022-06-20 (×3): 1 [drp] via OPHTHALMIC

## 2022-06-20 MED ORDER — EPINEPHRINE PF 1 MG/ML IJ SOLN
INTRAOCULAR | Status: DC | PRN
  Administered 2022-06-20: 500 mL

## 2022-06-20 MED ORDER — BSS IO SOLN
INTRAOCULAR | Status: DC | PRN
  Administered 2022-06-20: 15 mL via INTRAOCULAR

## 2022-06-20 MED ORDER — TETRACAINE HCL 0.5 % OP SOLN
1.0000 [drp] | OPHTHALMIC | Status: AC | PRN
  Administered 2022-06-20 (×3): 1 [drp] via OPHTHALMIC

## 2022-06-20 MED ORDER — STERILE WATER FOR IRRIGATION IR SOLN
Status: DC | PRN
  Administered 2022-06-20: 250 mL

## 2022-06-20 MED ORDER — NEOMYCIN-POLYMYXIN-DEXAMETH 3.5-10000-0.1 OP SUSP
OPHTHALMIC | Status: DC | PRN
  Administered 2022-06-20: 2 [drp] via OPHTHALMIC

## 2022-06-20 MED ORDER — EPINEPHRINE PF 1 MG/ML IJ SOLN
INTRAMUSCULAR | Status: AC
  Filled 2022-06-20: qty 2

## 2022-06-20 MED ORDER — SODIUM HYALURONATE 23MG/ML IO SOSY
PREFILLED_SYRINGE | INTRAOCULAR | Status: DC | PRN
  Administered 2022-06-20: 0.6 mL via INTRAOCULAR

## 2022-06-20 MED ORDER — LIDOCAINE HCL 3.5 % OP GEL
1.0000 | Freq: Once | OPHTHALMIC | Status: AC
  Administered 2022-06-20: 1 via OPHTHALMIC

## 2022-06-20 SURGICAL SUPPLY — 14 items
CATARACT SUITE SIGHTPATH (MISCELLANEOUS) ×2 IMPLANT
CLOTH BEACON ORANGE TIMEOUT ST (SAFETY) ×2 IMPLANT
EYE SHIELD UNIVERSAL CLEAR (GAUZE/BANDAGES/DRESSINGS) ×1 IMPLANT
FEE CATARACT SUITE SIGHTPATH (MISCELLANEOUS) ×1 IMPLANT
GLOVE BIOGEL PI IND STRL 7.0 (GLOVE) ×2 IMPLANT
GLOVE BIOGEL PI INDICATOR 7.0 (GLOVE) ×2
GLOVE SURG SS PI 7.0 STRL IVOR (GLOVE) ×1 IMPLANT
LENS IOL RAYNER 20.0 (Intraocular Lens) ×2 IMPLANT
LENS IOL RAYONE EMV 20.0 (Intraocular Lens) IMPLANT
PAD ARMBOARD 7.5X6 YLW CONV (MISCELLANEOUS) ×2 IMPLANT
SYR TB 1ML LL NO SAFETY (SYRINGE) ×2 IMPLANT
TAPE SURG TRANSPORE 1 IN (GAUZE/BANDAGES/DRESSINGS) IMPLANT
TAPE SURGICAL TRANSPORE 1 IN (GAUZE/BANDAGES/DRESSINGS) ×2
WATER STERILE IRR 250ML POUR (IV SOLUTION) ×2 IMPLANT

## 2022-06-20 NOTE — Interval H&P Note (Signed)
History and Physical Interval Note:  06/20/2022 11:33 AM  Frank [name]  has presented today for surgery, with the diagnosis of combined forms [name] related cataract; left.  The various methods of treatment have been discussed with the patient and family. After consideration of risks, benefits and other options for treatment, the patient has consented to  Procedure(s) with comments: CATARACT EXTRACTION PHACO AND INTRAOCULAR LENS PLACEMENT (IOC) (Left) - CDE:  as a surgical intervention.  The patient's history has been reviewed, patient examined, no change in status, stable for surgery.  I have reviewed the patient's chart and labs.  Questions were answered to the patient's satisfaction.     Baruch Goldmann

## 2022-06-20 NOTE — Transfer of Care (Signed)
Immediate Anesthesia Transfer of Care Note  Patient: Frank [name]  Procedure(s) Performed: CATARACT EXTRACTION PHACO AND INTRAOCULAR LENS PLACEMENT (IOC) (Left: Eye)  Patient Location: Short Stay  Anesthesia Type:MAC  Level of Consciousness: awake, alert  and oriented  Airway & Oxygen Therapy: Patient Spontanous Breathing  Post-op Assessment: Report given to RN and Post -op Vital signs reviewed and stable  Post vital signs: Reviewed and stable  Last Vitals:  Vitals Value Taken Time  BP 137/76 06/20/22 1156  Temp 36.4 C 06/20/22 1156  Pulse 57 06/20/22 1156  Resp 16 06/20/22 1156  SpO2 99 % 06/20/22 1156    Last Pain:  Vitals:   06/20/22 1156  TempSrc: Oral  PainSc: 0-No pain      Patients Stated Pain Goal: 8 (11/91/47 [id number])  Complications: No notable events documented.

## 2022-06-20 NOTE — Anesthesia Postprocedure Evaluation (Signed)
Anesthesia Post Note  Patient: Frank [name]  Procedure(s) Performed: CATARACT EXTRACTION PHACO AND INTRAOCULAR LENS PLACEMENT (IOC) (Left: Eye)  Patient location during evaluation: Phase II Anesthesia Type: MAC Level of consciousness: awake Pain management: pain level controlled Vital Signs Assessment: post-procedure vital signs reviewed and stable Respiratory status: spontaneous breathing and respiratory function stable Cardiovascular status: blood pressure returned to baseline and stable Postop Assessment: no headache and no apparent nausea or vomiting Anesthetic complications: no Comments: Late entry   No notable events documented.   Last Vitals:  Vitals:   06/20/22 1031 06/20/22 1156  BP: (!) 144/76 137/76  Pulse: (!) 57 (!) 57  Resp: 16 16  Temp: 36.6 C 36.4 C  SpO2: 100% 99%    Last Pain:  Vitals:   06/20/22 1156  TempSrc: Oral  PainSc: 0-No pain                 Louann Sjogren

## 2022-06-20 NOTE — Anesthesia Preprocedure Evaluation (Signed)
Anesthesia Evaluation  Patient identified by MRN, date of birth, ID band Patient awake    Reviewed: Allergy & Precautions, H&P , NPO status , Patient's Chart, lab work & pertinent test results, reviewed documented beta blocker date and time   Airway Mallampati: II  TM Distance: >3 FB Neck ROM: full    Dental no notable dental hx.    Pulmonary neg pulmonary ROS,    Pulmonary exam normal breath sounds clear to auscultation       Cardiovascular Exercise Tolerance: Good hypertension, negative cardio ROS   Rhythm:regular Rate:Normal     Neuro/Psych  Neuromuscular disease negative psych ROS   GI/Hepatic negative GI ROS, Neg liver ROS,   Endo/Other  negative endocrine ROS  Renal/GU negative Renal ROS  negative genitourinary   Musculoskeletal   Abdominal   Peds  Hematology  (+) Blood dyscrasia, anemia ,   Anesthesia Other Findings   Reproductive/Obstetrics negative OB ROS                             Anesthesia Physical Anesthesia Plan  ASA: 3  Anesthesia Plan: MAC   Post-op Pain Management:    Induction:   PONV Risk Score and Plan:   Airway Management Planned:   Additional Equipment:   Intra-op Plan:   Post-operative Plan:   Informed Consent: I have reviewed the patients History and Physical, chart, labs and discussed the procedure including the risks, benefits and alternatives for the proposed anesthesia with the patient or authorized representative who has indicated his/her understanding and acceptance.     Dental Advisory Given  Plan Discussed with: CRNA  Anesthesia Plan Comments:         Anesthesia Quick Evaluation

## 2022-06-20 NOTE — Discharge Instructions (Addendum)
Please discharge patient when stable, will follow up today with Dr. Wrzosek at the [facility] [place] office immediately following discharge.  Leave shield in place until visit.  All paperwork with discharge instructions will be given at the office.  [facility] Address:  [address]  [place], [place] [address]  

## 2022-06-20 NOTE — Op Note (Signed)
Date of procedure: 06/20/22  Pre-operative diagnosis: Visually significant age-related combined cataract, Left Eye (H25.812)  Post-operative diagnosis: Visually significant age-related combined cataract, Left Eye (H25.812)  Procedure: Removal of cataract via phacoemulsification and insertion of intra-ocular lens Rayner RAO200E +20.0D into the capsular bag of the Left Eye  Attending surgeon: Gerda Diss. Cooper Stamp, MD, MA  Anesthesia: MAC, Topical Akten  Complications: None  Estimated Blood Loss: <43m (minimal)  Specimens: None  Implants: As above  Indications:  Visually significant age-related cataract, Left Eye  Procedure:  The patient was seen and identified in the pre-operative area. The operative eye was identified and dilated.  The operative eye was marked.  Topical anesthesia was administered to the operative eye.     The patient was then to the operative suite and placed in the supine position.  A timeout was performed confirming the patient, procedure to be performed, and all other relevant information.   The patient's face was prepped and draped in the usual fashion for intra-ocular surgery.  A lid speculum was placed into the operative eye and the surgical microscope moved into place and focused.  An inferotemporal paracentesis was created using a 20 gauge paracentesis blade.  Shugarcaine was injected into the anterior chamber.  Viscoelastic was injected into the anterior chamber.  A temporal clear-corneal main wound incision was created using a 2.428mmicrokeratome.  A continuous curvilinear capsulorrhexis was initiated using an irrigating cystitome and completed using capsulorrhexis forceps.  Hydrodissection and hydrodeliniation were performed.  Viscoelastic was injected into the anterior chamber.  A phacoemulsification handpiece and a chopper as a second instrument were used to remove the nucleus and epinucleus. The irrigation/aspiration handpiece was used to remove any remaining  cortical material.   The capsular bag was reinflated with viscoelastic, checked, and found to be intact.  The intraocular lens was inserted into the capsular bag.  The irrigation/aspiration handpiece was used to remove any remaining viscoelastic.  The clear corneal wound and paracentesis wounds were then hydrated and checked with Weck-Cels to be watertight.  Maxitrol was instilled in the eye. The lid-speculum was removed.  The drape was removed.  The patient's face was cleaned with a wet and dry 4x4.    A clear shield was taped over the eye. The patient was taken to the post-operative care unit in good condition, having tolerated the procedure well.  Post-Op Instructions: [facility] and will receive all other orders and instructions.

## 2022-06-23 ENCOUNTER — Encounter (HOSPITAL_COMMUNITY): Payer: Self-pay | Admitting: Ophthalmology

## 2023-10-10 ENCOUNTER — Ambulatory Visit
Admission: EM | Admit: 2023-10-10 | Discharge: 2023-10-10 | Disposition: A | Discharge status: Home / Self Care | Payer: Medicare Other | Attending: Nurse Practitioner | Admitting: Nurse Practitioner

## 2023-10-10 DIAGNOSIS — H18892 Other specified disorders of cornea, left eye: Secondary | ICD-10-CM

## 2023-10-10 MED ORDER — POLYMYXIN B-TRIMETHOPRIM 10000-0.1 UNIT/ML-% OP SOLN: 1.0000 [drp] | Freq: Four times a day (QID) | OPHTHALMIC | 0 refills | Status: AC

## 2023-10-10 NOTE — Discharge Instructions (Signed)
Use eyedrops as prescribed.  . May take over-the-counter Tylenol as needed for pain or discomfort. Cool compresses to the left eye to help with pain or swelling as needed.  May apply warm compresses as needed for pain or discomfort. May continue the over-the-counter eyedrops you are using to help keep the eyes moist and decreased redness. Strict handwashing when applying medication.  Avoid rubbing or manipulating the eyes while symptoms persist. If you experience any change in your vision, sudden loss of vision, or other concerns, please follow-up with your eye doctor immediately for further evaluation. Follow-up as needed.

## 2023-10-10 NOTE — ED Provider Notes (Signed)
[facility]    CSN: [id number] Arrival date & time: 10/10/23  1015      History   Chief Complaint Chief Complaint  Patient presents with   Foreign Body in Eye    HPI Frank [name] is a 77 y.o. male.   The history is provided by the patient.   Patient presents for complaints of feeling like a foreign object is in the left eye after he was mowing last evening.  Patient states that he feels like there are "particles" in the left eye.  He denies pain, visual changes, drainage, tearing, eye itching, or eye swelling.  Patient reports that he did use some over-the-counter eyedrops.  States that he has a history of cataract surgery.  He denies use of glasses or contacts.  Past Medical History:  Diagnosis Date   Arthritis    left knee   Cancer (HCC)    colon cancer   History of kidney stones    Hypercholesteremia    Hypertension    Sleep apnea    uses cpap    Patient Active Problem List   Diagnosis Date Noted   Hiccups 02/20/2017   Acute lower UTI 02/17/2017   AKI (acute kidney injury) (HCC) 02/17/2017   Hypotension (arterial) 02/17/2017   Postoperative anemia 02/17/2017   Essential hypertension 02/17/2017   UTI (urinary tract infection) 02/17/2017   Lumbar radiculopathy 02/04/2017   Muscle weakness (generalized) 02/17/2012    Past Surgical History:  Procedure Laterality Date   ABDOMINAL EXPOSURE N/A 02/04/2017   Procedure: ABDOMINAL EXPOSURE;  Surgeon: Larina Earthly, MD;  Location: [facility] OR;  Service: Vascular;  Laterality: N/A;   ANTERIOR LAT LUMBAR FUSION Right 02/04/2017   Procedure: RIGHT SIDED LUMBAR 3-4, LUMBAR 4-5 LATERAL INTERBODY FUSION WITH INSTRUMENTATION AND ALLOGRAFT;  Surgeon: Estill Bamberg, MD;  Location: [facility] OR;  Service: Orthopedics;  Laterality: Right;  LEFT OR RIGHT SIDED LUMBAR 2-3, LUMBAR 3-4 LATERAL INTERBODY FUSION WITH INSTRUMENTATION AND ALLOGRAFT   ANTERIOR LUMBAR FUSION N/A 02/04/2017   Procedure: LUMBAR 5-SACRUM 1 ANTERIOR LUMBAR  FUSION WITH INSTRUMENTATION AND ALLOGRAFT;  Surgeon: Estill Bamberg, MD;  Location: [facility] OR;  Service: Orthopedics;  Laterality: N/A;  LUMBAR 4-5, LUMBAR 5-SACRUM 1 ANTERIOR LUMBAR FUSION WITH INSTRUMENTATION AND ALLOGRAFT   CATARACT EXTRACTION W/PHACO Right 06/06/2022   Procedure: CATARACT EXTRACTION PHACO AND INTRAOCULAR LENS PLACEMENT (IOC);  Surgeon: Fabio Pierce, MD;  Location: [facility];  Service: Ophthalmology;  Laterality: Right;  CDE 5.66   CATARACT EXTRACTION W/PHACO Left 06/20/2022   Procedure: CATARACT EXTRACTION PHACO AND INTRAOCULAR LENS PLACEMENT (IOC);  Surgeon: Fabio Pierce, MD;  Location: [facility];  Service: Ophthalmology;  Laterality: Left;  CDE: 6.97   COLON SURGERY     hx colon ca   COLONOSCOPY N/A 04/24/2014   Procedure: COLONOSCOPY;  Surgeon: Corbin Ade, MD;  Location: [facility];  Service: Endoscopy;  Laterality: N/A;  8:45 AM   HERNIA REPAIR     bilateral   POSTERIOR LUMBAR FUSION 4 LEVEL Bilateral 02/05/2017   Procedure: LUMBAR 2-3, LUMBAR 3-4, LUMBAR 4-5, LUMBAR 5-SACRUM 1 POSTERIOR SPINAL FUSION WITH INSTRUMENTATION AND ALLOGRAFT;  Surgeon: Estill Bamberg, MD;  Location: [facility] OR;  Service: Orthopedics;  Laterality: Bilateral;  LUMBAR 2-3, LUMBAR 3-4, LUMBAR 4-5, LUMBAR 5-SACRUM 1 POSTERIOR SPINAL FUSION WITH INSTRUMENTATION AND ALLOGRAFT       Home Medications    Prior to Admission medications   Medication Sig Start Date End Date Taking? Authorizing Provider  atorvastatin (LIPITOR) 40 MG  tablet Take 40 mg by mouth at bedtime.    [provider]  feeding supplement (BOOST HIGH PROTEIN) LIQD Take 1 Container by mouth 2 (two) times a week.    [provider]  feeding supplement, ENSURE ENLIVE, (ENSURE ENLIVE) LIQD Take 237 mLs by mouth 2 (two) times daily between meals. 02/21/17   Narda Bonds, MD  Iron-FA-B Cmp-C-Biot-Probiotic (FUSION PLUS) CAPS Take 1 capsule by mouth daily. 05/27/17   [provider]  lisinopril (PRINIVIL,ZESTRIL) 20 MG  tablet Take 20 mg by mouth daily.    [provider]  Multiple Vitamins-Iron (ONE DAILY MULTIVITAMIN/IRON) TABS Take 1 tablet by mouth daily with breakfast.    [provider]  vitamin C (ASCORBIC ACID) 500 MG tablet Take 500 mg by mouth daily.    [provider]    Family History History reviewed. No pertinent family history.  Social History Social History   Tobacco Use   Smoking status: Never   Smokeless tobacco: Never  Substance Use Topics   Alcohol use: No   Drug use: No     Allergies   No known allergies   Review of Systems Review of Systems Per HPI  Physical Exam Triage Vital Signs ED Triage Vitals  Encounter Vitals Group     BP 10/10/23 1045 114/69     Systolic BP Percentile --      Diastolic BP Percentile --      Pulse Rate 10/10/23 1045 78     Resp 10/10/23 1045 16     Temp 10/10/23 1045 98.3 F (36.8 C)     Temp Source 10/10/23 1045 Oral     SpO2 10/10/23 1045 97 %     Weight --      Height --      Head Circumference --      Peak Flow --      Pain Score 10/10/23 1047 0     Pain Loc --      Pain Education --      Exclude from Growth Chart --    No data found.  Updated Vital Signs BP 114/69 (BP Location: Right Arm)   Pulse 78   Temp 98.3 F (36.8 C) (Oral)   Resp 16   SpO2 97%   Visual Acuity Right Eye Distance: 20/25 Left Eye Distance: 20/30 Bilateral Distance: 20/20  Right Eye Near:   Left Eye Near:    Bilateral Near:     Physical Exam Vitals and nursing note reviewed.  Constitutional:      General: He is not in acute distress.    Appearance: Normal appearance.  HENT:     Head: Normocephalic.  Eyes:     General: Lids are normal. Vision grossly intact. No visual field deficit.       Left eye: No foreign body, discharge or hordeolum.     Extraocular Movements: Extraocular movements intact.     Conjunctiva/sclera: Conjunctivae normal.     Left eye: Left conjunctiva is not injected. No chemosis, exudate  or hemorrhage.    Pupils: Pupils are equal, round, and reactive to light.     Comments: Eye Exam: Eyelids everted and swept for foreign body. The eye was anesthetized with 2 drops of tetracaine and stained with fluorescein. Examination under woods lamp does not reveal a foreign body or area of increased stain uptake. The eye was then irrigated copiously with saline.   Pulmonary:     Effort: Pulmonary effort is normal.  Musculoskeletal:  Cervical back: Normal range of motion.  Lymphadenopathy:     Cervical: No cervical adenopathy.  Skin:    General: Skin is warm and dry.  Neurological:     General: No focal deficit present.     Mental Status: He is alert and oriented to person, place, and time.  Psychiatric:        Mood and Affect: Mood normal.        Behavior: Behavior normal.      UC Treatments / Results  Labs (all labs ordered are listed, but only abnormal results are displayed) Labs Reviewed - No data to display  EKG   Radiology No results found.  Procedures Procedures (including critical care time)  Medications Ordered in UC Medications - No data to display  Initial Impression / Assessment and Plan / UC Course  I have reviewed the triage vital signs and the nursing notes.  Pertinent labs & imaging results that were available during my care of the patient were reviewed by me and considered in my medical decision making (see chart for details).  Fluorescein stain did not show an obvious foreign body or corneal abrasion.  Will treat patient for possible superficial infection of the left eye.  Will treat with Polytrim eyedrops to apply for the next 7 days.  Supportive care recommendations were provided and discussed with the patient to include over-the-counter Tylenol for pain, warm or cool compresses to the eye for pain or swelling, and over-the-counter saline drops as needed for dryness or lubrication.  Patient was given indications of when follow-up with his eye  doctor would be necessary.  Patient is in agreement with this plan of care and verbalized understanding.  All questions were answered.  Patient stable for discharge.   Final Clinical Impressions(s) / UC Diagnoses   Final diagnoses:  None   Discharge Instructions   None    ED Prescriptions   None    PDMP not reviewed this encounter.   Abran Cantor, NP 10/10/23 [phone]

## 2023-10-10 NOTE — ED Triage Notes (Signed)
Pt reports after mowing his lawn x 1 day he has left eye irritation.   Feels like particles are in his eye

## 2024-03-18 ENCOUNTER — Ambulatory Visit: Admission: EM | Admit: 2024-03-18 | Discharge: 2024-03-18 | Disposition: A | Discharge status: Home / Self Care

## 2024-03-18 DIAGNOSIS — L723 Sebaceous cyst: Secondary | ICD-10-CM

## 2024-03-18 NOTE — Discharge Instructions (Signed)
 You have a blackhead on your back that has plugged up the will gland and created a cyst called a sebaceous cyst.  It is filled with the sebaceous secretions from this oral gland.  These are benign but can sometimes become infected or painful.  It is not currently infected so there is no rash to do anything for it, but if it is bothersome for you or starts becoming infected your primary care or dermatologist can do an excision procedure to remove the cyst sac.

## 2024-03-18 NOTE — ED Triage Notes (Signed)
 Insect bite to upper back x 1 week.

## 2024-03-19 NOTE — ED Provider Notes (Signed)
 [facility]    CSN: [id number] Arrival date & time: 03/18/24  0803      History   Chief Complaint No chief complaint on file.   HPI Frank [name] is a 78 y.o. male.   Presenting today requesting evaluation for a place on his upper back that he thinks may be a bug bite.  He denies pain, itching, drainage or blood in, known injury to the area.  So far not trying anything over-the-counter for symptoms.    Past Medical History:  Diagnosis Date   Arthritis    left knee   Cancer (HCC)    colon cancer   History of kidney stones    Hypercholesteremia    Hypertension    Sleep apnea    uses cpap    Patient Active Problem List   Diagnosis Date Noted   Hiccups 02/20/2017   Acute lower UTI 02/17/2017   AKI (acute kidney injury) (HCC) 02/17/2017   Hypotension (arterial) 02/17/2017   Postoperative anemia 02/17/2017   Essential hypertension 02/17/2017   UTI (urinary tract infection) 02/17/2017   Lumbar radiculopathy 02/04/2017   Muscle weakness (generalized) 02/17/2012    Past Surgical History:  Procedure Laterality Date   ABDOMINAL EXPOSURE N/A 02/04/2017   Procedure: ABDOMINAL EXPOSURE;  Surgeon: Mayo Speck, MD;  Location: [facility] OR;  Service: Vascular;  Laterality: N/A;   ANTERIOR LAT LUMBAR FUSION Right 02/04/2017   Procedure: RIGHT SIDED LUMBAR 3-4, LUMBAR 4-5 LATERAL INTERBODY FUSION WITH INSTRUMENTATION AND ALLOGRAFT;  Surgeon: Virl Grimes, MD;  Location: [facility] OR;  Service: Orthopedics;  Laterality: Right;  LEFT OR RIGHT SIDED LUMBAR 2-3, LUMBAR 3-4 LATERAL INTERBODY FUSION WITH INSTRUMENTATION AND ALLOGRAFT   ANTERIOR LUMBAR FUSION N/A 02/04/2017   Procedure: LUMBAR 5-SACRUM 1 ANTERIOR LUMBAR FUSION WITH INSTRUMENTATION AND ALLOGRAFT;  Surgeon: Virl Grimes, MD;  Location: [facility] OR;  Service: Orthopedics;  Laterality: N/A;  LUMBAR 4-5, LUMBAR 5-SACRUM 1 ANTERIOR LUMBAR FUSION WITH INSTRUMENTATION AND ALLOGRAFT   CATARACT EXTRACTION W/PHACO Right 06/06/2022    Procedure: CATARACT EXTRACTION PHACO AND INTRAOCULAR LENS PLACEMENT (IOC);  Surgeon: Tarri Farm, MD;  Location: [facility];  Service: Ophthalmology;  Laterality: Right;  CDE 5.66   CATARACT EXTRACTION W/PHACO Left 06/20/2022   Procedure: CATARACT EXTRACTION PHACO AND INTRAOCULAR LENS PLACEMENT (IOC);  Surgeon: Tarri Farm, MD;  Location: [facility];  Service: Ophthalmology;  Laterality: Left;  CDE: 6.97   COLON SURGERY     hx colon ca   COLONOSCOPY N/A 04/24/2014   Procedure: COLONOSCOPY;  Surgeon: Suzette Espy, MD;  Location: [facility];  Service: Endoscopy;  Laterality: N/A;  8:45 AM   HERNIA REPAIR     bilateral   POSTERIOR LUMBAR FUSION 4 LEVEL Bilateral 02/05/2017   Procedure: LUMBAR 2-3, LUMBAR 3-4, LUMBAR 4-5, LUMBAR 5-SACRUM 1 POSTERIOR SPINAL FUSION WITH INSTRUMENTATION AND ALLOGRAFT;  Surgeon: Virl Grimes, MD;  Location: [facility] OR;  Service: Orthopedics;  Laterality: Bilateral;  LUMBAR 2-3, LUMBAR 3-4, LUMBAR 4-5, LUMBAR 5-SACRUM 1 POSTERIOR SPINAL FUSION WITH INSTRUMENTATION AND ALLOGRAFT       Home Medications    Prior to Admission medications   Medication Sig Start Date End Date Taking? Authorizing Provider  atorvastatin  (LIPITOR) 40 MG tablet Take 40 mg by mouth at bedtime.    [provider]  feeding supplement (BOOST HIGH PROTEIN) LIQD Take 1 Container by mouth 2 (two) times a week.    [provider]  feeding supplement, ENSURE ENLIVE, (ENSURE ENLIVE) LIQD Take 237 mLs by  mouth 2 (two) times daily between meals. 02/21/17   Verlyn Goad, MD  Iron -FA-B Cmp-C-Biot-Probiotic (FUSION PLUS) CAPS Take 1 capsule by mouth daily. 05/27/17   [provider]  lisinopril  (PRINIVIL ,ZESTRIL ) 20 MG tablet Take 20 mg by mouth daily.    [provider]  Multiple Vitamins-Iron  (ONE DAILY MULTIVITAMIN/IRON ) TABS Take 1 tablet by mouth daily with breakfast.    [provider]  vitamin C  (ASCORBIC ACID ) 500 MG tablet Take 500 mg by mouth daily.     [provider]    Family History No family history on file.  Social History Social History   Tobacco Use   Smoking status: Never   Smokeless tobacco: Never  Substance Use Topics   Alcohol use: No   Drug use: No     Allergies   No known allergies   Review of Systems Review of Systems HPI  Physical Exam Triage Vital Signs ED Triage Vitals  Encounter Vitals Group     BP 03/18/24 0812 (!) 150/82     Systolic BP Percentile --      Diastolic BP Percentile --      Pulse Rate 03/18/24 0812 61     Resp 03/18/24 0812 18     Temp 03/18/24 0812 97.7 F (36.5 C)     Temp Source 03/18/24 0812 Oral     SpO2 03/18/24 0812 97 %     Weight --      Height --      Head Circumference --      Peak Flow --      Pain Score 03/18/24 0813 0     Pain Loc --      Pain Education --      Exclude from Growth Chart --    No data found.  Updated Vital Signs BP (!) 150/82 (BP Location: Right Arm)   Pulse 61   Temp 97.7 F (36.5 C) (Oral)   Resp 18   SpO2 97%   Visual Acuity Right Eye Distance:   Left Eye Distance:   Bilateral Distance:    Right Eye Near:   Left Eye Near:    Bilateral Near:     Physical Exam Vitals and nursing note reviewed.  Constitutional:      Appearance: Normal appearance.  HENT:     Head: Atraumatic.  Eyes:     Extraocular Movements: Extraocular movements intact.     Conjunctiva/sclera: Conjunctivae normal.  Cardiovascular:     Rate and Rhythm: Normal rate.  Pulmonary:     Effort: Pulmonary effort is normal.  Musculoskeletal:        General: Normal range of motion.     Cervical back: Normal range of motion and neck supple.  Skin:    General: Skin is warm and dry.     Comments: 1.5 cm sebaceous cyst present with central blackhead to upper back.  No erythema, fluctuance, tenderness to palpation  Neurological:     Mental Status: Mental status is at baseline.  Psychiatric:        Mood and Affect: Mood normal.        Thought Content:  Thought content normal.        Judgment: Judgment normal.      UC Treatments / Results  Labs (all labs ordered are listed, but only abnormal results are displayed) Labs Reviewed - No data to display  EKG   Radiology No results found.  Procedures Procedures (including critical care time)  Medications Ordered  in UC Medications - No data to display  Initial Impression / Assessment and Plan / UC Course  I have reviewed the triage vital signs and the nursing notes.  Pertinent labs & imaging results that were available during my care of the patient were reviewed by me and considered in my medical decision making (see chart for details).     Noninfected sebaceous cyst.  Education provided, no need for antibiotics or procedure at this time.  Follow-up with primary care for recheck  Final Clinical Impressions(s) / UC Diagnoses   Final diagnoses:  Sebaceous cyst     Discharge Instructions      You have a blackhead on your back that has plugged up the will gland and created a cyst called a sebaceous cyst.  It is filled with the sebaceous secretions from this oral gland.  These are benign but can sometimes become infected or painful.  It is not currently infected so there is no rash to do anything for it, but if it is bothersome for you or starts becoming infected your primary care or dermatologist can do an excision procedure to remove the cyst sac.    ED Prescriptions   None    PDMP not reviewed this encounter.   Corbin Dess, [place] 03/19/24 1526

## 2024-03-30 ENCOUNTER — Encounter: Payer: Self-pay | Admitting: *Deleted

## 2024-06-07 ENCOUNTER — Ambulatory Visit: Admitting: Internal Medicine

## 2024-06-07 ENCOUNTER — Encounter: Payer: Self-pay | Admitting: Internal Medicine

## 2024-06-07 VITALS — BP 127/70 | HR 57 | Temp 98.0°F | Ht 74.0 in | Wt 189.4 lb

## 2024-06-07 DIAGNOSIS — Z01818 Encounter for other preprocedural examination: Secondary | ICD-10-CM | POA: Diagnosis not present

## 2024-06-07 DIAGNOSIS — Z1211 Encounter for screening for malignant neoplasm of colon: Secondary | ICD-10-CM

## 2024-06-07 NOTE — Progress Notes (Signed)
 Primary Care Physician:  Shona Norleen PEDLAR, MD Primary Gastroenterologist:  Dr. [name] History & Physical: HPI:  Frank [name] is a 78 y.o. male here for consideration of 1 more screening colonoscopy had a completely normal colonoscopy age 63.  This gentleman remains extremely active.  He had spine surgery which with lower extremity pain tremendously.  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  .  No specifically no rectal bleeding, etc. no family history  Past Medical History:  Diagnosis Date   Arthritis    left knee   Cancer (HCC)    colon cancer   History of kidney stones    Hypercholesteremia    Hypertension    Sleep apnea    uses cpap    Past Surgical History:  Procedure Laterality Date   ABDOMINAL EXPOSURE N/A 02/04/2017   Procedure: ABDOMINAL EXPOSURE;  Surgeon: Krystal [name], MD;  Location: [facility] OR;  Service: Vascular;  Laterality: N/A;   ANTERIOR LAT LUMBAR FUSION Right 02/04/2017   Procedure: RIGHT SIDED LUMBAR 3-4, LUMBAR 4-5 LATERAL INTERBODY FUSION WITH INSTRUMENTATION AND ALLOGRAFT;  Surgeon: Oneil Priestly, MD;  Location: [facility] OR;  Service: Orthopedics;  Laterality: Right;  LEFT OR RIGHT SIDED LUMBAR 2-3, LUMBAR 3-4 LATERAL INTERBODY FUSION WITH INSTRUMENTATION AND ALLOGRAFT   ANTERIOR LUMBAR FUSION N/A 02/04/2017   Procedure: LUMBAR 5-SACRUM 1 ANTERIOR LUMBAR FUSION WITH INSTRUMENTATION AND ALLOGRAFT;  Surgeon: Oneil Priestly, MD;  Location: [facility] OR;  Service: Orthopedics;  Laterality: N/A;  LUMBAR 4-5, LUMBAR 5-SACRUM 1 ANTERIOR LUMBAR FUSION WITH INSTRUMENTATION AND ALLOGRAFT   CATARACT EXTRACTION W/PHACO Right 06/06/2022   Procedure: CATARACT EXTRACTION PHACO AND INTRAOCULAR LENS PLACEMENT (IOC);  Surgeon: Harrie Agent, MD;  Location: [facility];  Service: Ophthalmology;  Laterality: Right;  CDE 5.66   CATARACT EXTRACTION W/PHACO Left 06/20/2022   Procedure: CATARACT EXTRACTION PHACO AND INTRAOCULAR LENS PLACEMENT (IOC);   Surgeon: Harrie Agent, MD;  Location: [facility];  Service: Ophthalmology;  Laterality: Left;  CDE: 6.97   COLON SURGERY     hx colon ca   COLONOSCOPY N/A 04/24/2014   Procedure: COLONOSCOPY;  Surgeon: [name], MD;  Location: [facility];  Service: Endoscopy;  Laterality: N/A;  8:45 AM   HERNIA REPAIR     bilateral   POSTERIOR LUMBAR FUSION 4 LEVEL Bilateral 02/05/2017   Procedure: LUMBAR 2-3, LUMBAR 3-4, LUMBAR 4-5, LUMBAR 5-SACRUM 1 POSTERIOR SPINAL FUSION WITH INSTRUMENTATION AND ALLOGRAFT;  Surgeon: Oneil Priestly, MD;  Location: [facility] OR;  Service: Orthopedics;  Laterality: Bilateral;  LUMBAR 2-3, LUMBAR 3-4, LUMBAR 4-5, LUMBAR 5-SACRUM 1 POSTERIOR SPINAL FUSION WITH INSTRUMENTATION AND ALLOGRAFT    Prior to Admission medications   Medication Sig Start Date End Date Taking? Authorizing Provider  atorvastatin  (LIPITOR) 40 MG tablet Take 40 mg by mouth at bedtime.   Yes [provider]  feeding supplement, ENSURE ENLIVE, (ENSURE ENLIVE) LIQD Take 237 mLs by mouth 2 (two) times daily between meals. 02/21/17  Yes Briana Elgin LABOR, MD  Iron-FA-B Cmp-C-Biot-Probiotic (FUSION PLUS) CAPS Take 1 capsule by mouth daily. 05/27/17  Yes [provider]  lisinopril  (PRINIVIL ,ZESTRIL ) 20 MG tablet Take 20 mg by mouth daily.   Yes [provider]  Multiple Vitamins-Iron (ONE DAILY MULTIVITAMIN/IRON) TABS Take 1 tablet by mouth daily with breakfast.   Yes [provider]  vitamin C (ASCORBIC ACID ) 500 MG tablet Take 500 mg by mouth daily.   Yes [provider]  Allergies as of 06/07/2024 - Review Complete 06/07/2024  Allergen Reaction Noted   No known allergies  02/03/2017    No family history on file.  Social History   Socioeconomic History   Marital status: Unknown    Spouse name: Not on file   Number of children: Not on file   Years of education: Not on file   Highest education level: Not on file  Occupational History   Not on file  Tobacco Use    Smoking status: Never   Smokeless tobacco: Never  Substance and Sexual Activity   Alcohol use: No   Drug use: No   Sexual activity: Not on file  Other Topics Concern   Not on file  Social History Narrative   ** Merged History Encounter **       Social Drivers of Health   Financial Resource Strain: Not on file  Food Insecurity: Not on file  Transportation Needs: Not on file  Physical Activity: Not on file  Stress: Not on file  Social Connections: Not on file  Intimate Partner Violence: Not on file    Review of Systems: See HPI, otherwise negative ROS  Physical Exam: BP 127/70 (BP Location: Right Arm, Patient Position: Sitting, Cuff Size: Normal)   Pulse (!) 57   Temp 98 F (36.7 C) (Oral)   Ht 6' 2 (1.88 m)   Wt 189 lb 6.4 oz (85.9 kg)   SpO2 96%   BMI 24.32 kg/m  General:   Alert,  Well-developed, well-nourished, pleasant and cooperative in NAD Neck:  Supple; no masses or thyromegaly. No significant cervical adenopathy. Lungs:  Clear throughout to auscultation.   No wheezes, crackles, or rhonchi. No acute distress. Heart:  Regular rate and rhythm; no murmurs, clicks, rubs,  or gallops. Abdomen: Non-distended, normal bowel sounds.  Soft and nontender without appreciable mass or hepatosplenomegaly.   Impression/Plan: 78 year old gentleman with a negative colonoscopy in 2005, 2015.  He remains a very healthy and active 78 year old gentleman;  he desires 1 more screening colonoscopy.  This is certainly not unreasonable.  Recommendations: I have offered him a average risk rating colonoscopy ASA 2/3 room 1 okay.The risks, benefits, limitations, alternatives and imponderables have been reviewed with the patient. Questions have been answered. All parties are agreeable.       Notice: This dictation was prepared with Dragon dictation along with smaller phrase technology. Any transcriptional errors that result from this process are unintentional and may not be corrected upon  review.

## 2024-06-07 NOTE — Patient Instructions (Signed)
 It was good to see you again today!  As discussed, we will check your colon 1 more time with a screening colonoscopy.  ASA 2.  Further recommendations to follow after the procedures been performed.

## 2024-06-30 ENCOUNTER — Telehealth: Payer: Self-pay | Admitting: *Deleted

## 2024-06-30 NOTE — Telephone Encounter (Signed)
 Called pt, LM with spouse to call back to schedule TCS with Dr. Shaaron, ASA 2

## 2024-08-02 NOTE — Telephone Encounter (Signed)
 Lmtcb, letter mailed

## 2024-09-08 NOTE — Telephone Encounter (Signed)
 LMTRC  Pt left vm to schedule procedure

## 2024-09-12 ENCOUNTER — Encounter: Payer: Self-pay | Admitting: *Deleted

## 2024-09-12 ENCOUNTER — Other Ambulatory Visit: Payer: Self-pay | Admitting: *Deleted

## 2024-09-12 MED ORDER — PEG 3350-KCL-NA BICARB-NACL 420 G PO SOLR
4000.0000 mL | Freq: Once | ORAL | 0 refills | Status: AC
Start: 2024-09-12 — End: 2024-09-12

## 2024-09-12 NOTE — Telephone Encounter (Signed)
 Pt has been scheduled for 09/30/24. Instructions mailed and prep sent to pharmacy.

## 2024-09-30 ENCOUNTER — Encounter (HOSPITAL_COMMUNITY)
Admission: RE | Disposition: A | Discharge status: Home / Self Care | Payer: Self-pay | Source: Home / Self Care | Attending: Internal Medicine

## 2024-09-30 ENCOUNTER — Ambulatory Visit (HOSPITAL_COMMUNITY): Admitting: Anesthesiology

## 2024-09-30 ENCOUNTER — Other Ambulatory Visit: Payer: Self-pay

## 2024-09-30 ENCOUNTER — Encounter (HOSPITAL_COMMUNITY): Payer: Self-pay | Admitting: Internal Medicine

## 2024-09-30 ENCOUNTER — Ambulatory Visit (HOSPITAL_COMMUNITY)
Admission: RE | Admit: 2024-09-30 | Discharge: 2024-09-30 | Disposition: A | Discharge status: Home / Self Care | Attending: Internal Medicine | Admitting: Internal Medicine

## 2024-09-30 DIAGNOSIS — I10 Essential (primary) hypertension: Secondary | ICD-10-CM | POA: Insufficient documentation

## 2024-09-30 DIAGNOSIS — D649 Anemia, unspecified: Secondary | ICD-10-CM | POA: Diagnosis not present

## 2024-09-30 DIAGNOSIS — Z1211 Encounter for screening for malignant neoplasm of colon: Secondary | ICD-10-CM | POA: Insufficient documentation

## 2024-09-30 DIAGNOSIS — G4730 Sleep apnea, unspecified: Secondary | ICD-10-CM | POA: Diagnosis not present

## 2024-09-30 DIAGNOSIS — K5730 Diverticulosis of large intestine without perforation or abscess without bleeding: Secondary | ICD-10-CM | POA: Insufficient documentation

## 2024-09-30 DIAGNOSIS — Z85038 Personal history of other malignant neoplasm of large intestine: Secondary | ICD-10-CM | POA: Insufficient documentation

## 2024-09-30 DIAGNOSIS — Z79899 Other long term (current) drug therapy: Secondary | ICD-10-CM | POA: Diagnosis not present

## 2024-09-30 DIAGNOSIS — Z87442 Personal history of urinary calculi: Secondary | ICD-10-CM | POA: Diagnosis not present

## 2024-09-30 HISTORY — PX: COLONOSCOPY: SHX5424

## 2024-09-30 SURGERY — COLONOSCOPY
Anesthesia: Monitor Anesthesia Care

## 2024-09-30 MED ORDER — LACTATED RINGERS IV SOLN: INTRAVENOUS | Status: DC

## 2024-09-30 MED ORDER — PROPOFOL 10 MG/ML IV BOLUS
INTRAVENOUS | Status: DC | PRN
  Administered 2024-09-30: 175 ug/kg/min via INTRAVENOUS
  Administered 2024-09-30: 100 mg via INTRAVENOUS

## 2024-09-30 MED ORDER — LIDOCAINE HCL (CARDIAC) PF 100 MG/5ML IV SOSY
PREFILLED_SYRINGE | INTRAVENOUS | Status: DC | PRN
  Administered 2024-09-30: 50 mg via INTRAVENOUS

## 2024-09-30 NOTE — Discharge Instructions (Signed)
  Colonoscopy Discharge Instructions  Read the instructions outlined below and refer to this sheet in the next few weeks. These discharge instructions provide you with general information on caring for yourself after you leave the hospital. Your doctor may also give you specific instructions. While your treatment has been planned according to the most current medical practices available, unavoidable complications occasionally occur. If you have any problems or questions after discharge, call Dr. Shaaron at [phone]. ACTIVITY You may resume your regular activity, but move at a slower pace for the next 24 hours.  Take frequent rest periods for the next 24 hours.  Walking will help get rid of the air and reduce the bloated feeling in your belly (abdomen).  No driving for 24 hours (because of the medicine (anesthesia) used during the test).   Do not sign any important legal documents or operate any machinery for 24 hours (because of the anesthesia used during the test).  NUTRITION Drink plenty of fluids.  You may resume your normal diet as instructed by your doctor.  Begin with a light meal and progress to your normal diet. Heavy or fried foods are harder to digest and may make you feel sick to your stomach (nauseated).  Avoid alcoholic beverages for 24 hours or as instructed.  MEDICATIONS You may resume your normal medications unless your doctor tells you otherwise.  WHAT YOU CAN EXPECT TODAY Some feelings of bloating in the abdomen.  Passage of more gas than usual.  Spotting of blood in your stool or on the toilet paper.  IF YOU HAD POLYPS REMOVED DURING THE COLONOSCOPY: No aspirin products for 7 days or as instructed.  No alcohol for 7 days or as instructed.  Eat a soft diet for the next 24 hours.  FINDING OUT THE RESULTS OF YOUR TEST Not all test results are available during your visit. If your test results are not back during the visit, make an appointment with your caregiver to find out the  results. Do not assume everything is normal if you have not heard from your caregiver or the medical facility. It is important for you to follow up on all of your test results.  SEEK IMMEDIATE MEDICAL ATTENTION IF: You have more than a spotting of blood in your stool.  Your belly is swollen (abdominal distention).  You are nauseated or vomiting.  You have a temperature over 101.  You have abdominal pain or discomfort that is severe or gets worse throughout the day.     you have diverticulosis.  Otherwise, your colon appeared normal  A future colonoscopy is not recommended unless new symptoms develop

## 2024-09-30 NOTE — Transfer of Care (Signed)
 Immediate Anesthesia Transfer of Care Note  Patient: Frank [name]  Procedure(s) Performed: COLONOSCOPY  Patient Location: Endoscopy Unit  Anesthesia Type:General  Level of Consciousness: drowsy, patient cooperative, and responds to stimulation  Airway & Oxygen Therapy: Patient Spontanous Breathing  Post-op Assessment: Report given to RN and Post -op Vital signs reviewed and stable  Post vital signs: Reviewed and stable  Last Vitals:  Vitals Value Taken Time  BP 97/49 09/30/24 11:02  Temp 36.4 C 09/30/24 11:02  Pulse 69 09/30/24 11:02  Resp 20 09/30/24 11:02  SpO2 98 % 09/30/24 11:02    Last Pain:  Vitals:   09/30/24 1102  TempSrc: Oral  PainSc:       Patients Stated Pain Goal: 5 (09/30/24 0926)  Complications: No notable events documented.

## 2024-09-30 NOTE — Anesthesia Postprocedure Evaluation (Signed)
 Anesthesia Post Note  Patient: Frank [name]  Procedure(s) Performed: COLONOSCOPY  Patient location during evaluation: Phase II Anesthesia Type: MAC Level of consciousness: awake Pain management: pain level controlled Vital Signs Assessment: post-procedure vital signs reviewed and stable Respiratory status: spontaneous breathing and respiratory function stable Cardiovascular status: blood pressure returned to baseline and stable Postop Assessment: no headache and no apparent nausea or vomiting Anesthetic complications: no Comments: Late entry   No notable events documented.   Last Vitals:  Vitals:   09/30/24 1102 09/30/24 1109  BP: (!) 97/49 125/60  Pulse: 69 66  Resp: 20 18  Temp: (!) 36.4 C   SpO2: 98% 97%    Last Pain:  Vitals:   09/30/24 1112  TempSrc:   PainSc: 0-No pain                 Frank [name]

## 2024-09-30 NOTE — H&P (Addendum)
 @LOGO @   Gastroenterology Progress Note    Primary Care Physician:  Shona Norleen PEDLAR, MD Primary Gastroenterologist:  Dr. [name] History & Physical: HPI:  Frank [name] is a 78 y.o. male here for    Surveillance colonoscopy.  Negative colonoscopy 2000 and2015.  Patient reminded me he had limited resection for colon cancer about 30 years ago. Past Medical History:  Diagnosis Date   Arthritis    left knee   Cancer (HCC)    colon cancer   History of kidney stones    Hypercholesteremia    Hypertension    Sleep apnea    uses cpap    Past Surgical History:  Procedure Laterality Date   ABDOMINAL EXPOSURE N/A 02/04/2017   Procedure: ABDOMINAL EXPOSURE;  Surgeon: Krystal [name], MD;  Location: [facility] OR;  Service: Vascular;  Laterality: N/A;   ANTERIOR LAT LUMBAR FUSION Right 02/04/2017   Procedure: RIGHT SIDED LUMBAR 3-4, LUMBAR 4-5 LATERAL INTERBODY FUSION WITH INSTRUMENTATION AND ALLOGRAFT;  Surgeon: Oneil Priestly, MD;  Location: [facility] OR;  Service: Orthopedics;  Laterality: Right;  LEFT OR RIGHT SIDED LUMBAR 2-3, LUMBAR 3-4 LATERAL INTERBODY FUSION WITH INSTRUMENTATION AND ALLOGRAFT   ANTERIOR LUMBAR FUSION N/A 02/04/2017   Procedure: LUMBAR 5-SACRUM 1 ANTERIOR LUMBAR FUSION WITH INSTRUMENTATION AND ALLOGRAFT;  Surgeon: Oneil Priestly, MD;  Location: [facility] OR;  Service: Orthopedics;  Laterality: N/A;  LUMBAR 4-5, LUMBAR 5-SACRUM 1 ANTERIOR LUMBAR FUSION WITH INSTRUMENTATION AND ALLOGRAFT   CATARACT EXTRACTION W/PHACO Right 06/06/2022   Procedure: CATARACT EXTRACTION PHACO AND INTRAOCULAR LENS PLACEMENT (IOC);  Surgeon: Harrie Agent, MD;  Location: [facility];  Service: Ophthalmology;  Laterality: Right;  CDE 5.66   CATARACT EXTRACTION W/PHACO Left 06/20/2022   Procedure: CATARACT EXTRACTION PHACO AND INTRAOCULAR LENS PLACEMENT (IOC);  Surgeon: Harrie Agent, MD;  Location: [facility];  Service: Ophthalmology;  Laterality: Left;  CDE: 6.97   COLON SURGERY     hx colon ca   COLONOSCOPY N/A  04/24/2014   Procedure: COLONOSCOPY;  Surgeon: [name], MD;  Location: [facility];  Service: Endoscopy;  Laterality: N/A;  8:45 AM   HERNIA REPAIR     bilateral   POSTERIOR LUMBAR FUSION 4 LEVEL Bilateral 02/05/2017   Procedure: LUMBAR 2-3, LUMBAR 3-4, LUMBAR 4-5, LUMBAR 5-SACRUM 1 POSTERIOR SPINAL FUSION WITH INSTRUMENTATION AND ALLOGRAFT;  Surgeon: Oneil Priestly, MD;  Location: [facility] OR;  Service: Orthopedics;  Laterality: Bilateral;  LUMBAR 2-3, LUMBAR 3-4, LUMBAR 4-5, LUMBAR 5-SACRUM 1 POSTERIOR SPINAL FUSION WITH INSTRUMENTATION AND ALLOGRAFT    Prior to Admission medications   Medication Sig Start Date End Date Taking? Authorizing Provider  atorvastatin  (LIPITOR) 40 MG tablet Take 40 mg by mouth at bedtime.   Yes [provider]  feeding supplement, ENSURE ENLIVE, (ENSURE ENLIVE) LIQD Take 237 mLs by mouth 2 (two) times daily between meals. 02/21/17  Yes Briana Elgin LABOR, MD  lisinopril  (PRINIVIL ,ZESTRIL ) 20 MG tablet Take 20 mg by mouth daily.   Yes [provider]  vitamin C (ASCORBIC ACID ) 500 MG tablet Take 500 mg by mouth daily.   Yes [provider]  Iron-FA-B Cmp-C-Biot-Probiotic (FUSION PLUS) CAPS Take 1 capsule by mouth daily. 05/27/17   [provider]  Multiple Vitamins-Iron (ONE DAILY MULTIVITAMIN/IRON) TABS Take 1 tablet by mouth daily with breakfast.    [provider]    Allergies as of 09/12/2024 - Review Complete 06/07/2024  Allergen Reaction Noted   No known allergies  02/03/2017    History reviewed.  No pertinent family history.  Social History   Socioeconomic History   Marital status: Married    Spouse name: Not on file   Number of children: Not on file   Years of education: Not on file   Highest education level: Not on file  Occupational History   Not on file  Tobacco Use   Smoking status: Never   Smokeless tobacco: Never  Vaping Use   Vaping status: Never Used  Substance and Sexual Activity   Alcohol use:  No   Drug use: No   Sexual activity: Not on file  Other Topics Concern   Not on file  Social History Narrative   ** Merged History Encounter **       Social Drivers of Health   Financial Resource Strain: Not on file  Food Insecurity: Not on file  Transportation Needs: Not on file  Physical Activity: Not on file  Stress: Not on file  Social Connections: Not on file  Intimate Partner Violence: Not on file    Review of Systems   See HPI, otherwise negative ROS  Physical Exam: BP 135/61   Pulse 63   Temp 97.9 F (36.6 C) (Oral)   Resp 19   Ht 6' 2 (1.88 m)   Wt 86.2 kg   SpO2 96%   BMI 24.39 kg/m  General:   Alert,  Well-developed, well-nourished, pleasant and cooperative in NAD Neck:  Supple; no masses or thyromegaly. No significant cervical adenopathy. Lungs:  Clear throughout to auscultation.   No wheezes, crackles, or rhonchi. No acute distress. Heart:  Regular rate and rhythm; no murmurs, clicks, rubs,  or gallops. Abdomen: Non-distended, normal bowel sounds.  Soft and nontender without appreciable mass or hepatosplenomegaly.    Impression/Plan:      78 year old gentleman here for 1 more Surveillancecolonoscopy.  The risks, benefits, limitations, alternatives and imponderables have been reviewed with the patient. Questions have been answered. All parties are agreeable.     Notice: This dictation was prepared with Dragon dictation along with smaller phrase technology. Any transcriptional errors that result from this process are unintentional and may not be corrected upon review.

## 2024-09-30 NOTE — Anesthesia Preprocedure Evaluation (Signed)
 Anesthesia Evaluation  Patient identified by MRN, date of birth, ID band Patient awake    Reviewed: Allergy & Precautions, H&P , NPO status , Patient's Chart, lab work & pertinent test results, reviewed documented beta blocker date and time   Airway Mallampati: II  TM Distance: >3 FB Neck ROM: full    Dental no notable dental hx.    Pulmonary neg pulmonary ROS, sleep apnea    Pulmonary exam normal breath sounds clear to auscultation       Cardiovascular Exercise Tolerance: Good hypertension, negative cardio ROS  Rhythm:regular Rate:Normal     Neuro/Psych  Neuromuscular disease negative neurological ROS  negative psych ROS   GI/Hepatic negative GI ROS, Neg liver ROS,,,  Endo/Other  negative endocrine ROS    Renal/GU Renal diseasenegative Renal ROS  negative genitourinary   Musculoskeletal   Abdominal   Peds  Hematology negative hematology ROS (+) Blood dyscrasia, anemia   Anesthesia Other Findings   Reproductive/Obstetrics negative OB ROS                              Anesthesia Physical Anesthesia Plan  ASA: 2  Anesthesia Plan: MAC   Post-op Pain Management:    Induction:   PONV Risk Score and Plan: Propofol  infusion  Airway Management Planned:   Additional Equipment:   Intra-op Plan:   Post-operative Plan:   Informed Consent: I have reviewed the patients History and Physical, chart, labs and discussed the procedure including the risks, benefits and alternatives for the proposed anesthesia with the patient or authorized representative who has indicated his/her understanding and acceptance.     Dental Advisory Given  Plan Discussed with: CRNA  Anesthesia Plan Comments:         Anesthesia Quick Evaluation

## 2024-09-30 NOTE — Op Note (Signed)
 [facility] Patient Name: Frank [name] Procedure Date: 09/30/2024 10:28 AM MRN: [id number] Date of Birth: 07/24/1946 Attending MD: Lamar Ozell Hollingshead , MD, [id number] CSN: [id number] Age: 78 Admit Type: Outpatient Procedure:                Colonoscopy Indications:              High risk colon cancer surveillance: Personal                            history of colon cancer Providers:                Lamar Ozell Hollingshead, MD, Crystal Page, Gordy Lonni Balm, Technician Referring MD:              Medicines:                Propofol  per Anesthesia Complications:            No immediate complications. Estimated Blood Loss:     Estimated blood loss: none. Procedure:                Pre-Anesthesia Assessment:                           - Prior to the procedure, a History and Physical                            was performed, and patient medications and                            allergies were reviewed. The patient's tolerance of                            previous anesthesia was also reviewed. The risks                            and benefits of the procedure and the sedation                            options and risks were discussed with the patient.                            All questions were answered, and informed consent                            was obtained. ASA Grade Assessment: III - A patient                            with severe systemic disease. After reviewing the                            risks and benefits, the patient was deemed in  satisfactory condition to undergo the procedure.                           After obtaining informed consent, the colonoscope                            was passed under direct vision. Throughout the                            procedure, the patient's blood pressure, pulse, and                            oxygen saturations were monitored continuously. The                             [id number] ([id number]) Colon was introduced through                            the anus and advanced to the the cecum, identified                            by appendiceal orifice and ileocecal valve. The                            colonoscopy was performed without difficulty. The                            patient tolerated the procedure well. The quality                            of the bowel preparation was adequate. The                            ileocecal valve, appendiceal orifice, and rectum                            were photographed. Scope In: 10:43:53 AM Scope Out: 11:00:21 AM Scope Withdrawal Time: 0 hours 5 minutes 19 seconds  Total Procedure Duration: 0 hours 16 minutes 28 seconds  Findings:      The perianal and digital rectal examinations were normal.      Scattered diverticula were found in the sigmoid colon and descending       colon.      The exam was otherwise without abnormality on direct and retroflexion       views. Surgical anastomosis not appearing. Impression:               - Diverticulosis in the sigmoid colon and in the                            descending colon.                           - The examination was otherwise normal on direct  and retroflexion views.                           - No specimens collected. Moderate Sedation:      Moderate (conscious) sedation was personally administered by an       anesthesia professional. The following parameters were monitored: oxygen       saturation, heart rate, blood pressure, respiratory rate, EKG, adequacy       of pulmonary ventilation, and response to care. Recommendation:           - Patient has a contact number available for                            emergencies. The signs and symptoms of potential                            delayed complications were discussed with the                            patient. Return to normal activities tomorrow.                            Written  discharge instructions were provided to the                            patient.                           - Resume previous diet.                           - Continue present medications.                           - No repeat colonoscopy due to age.                           - Return to GI clinic PRN. Procedure Code(s):        --- Professional ---                           [phone], Colonoscopy, flexible; diagnostic, including                            collection of specimen(s) by brushing or washing,                            when performed (separate procedure) Diagnosis Code(s):        --- Professional ---                           S14.961, Personal history of other malignant                            neoplasm of large intestine  K57.30, Diverticulosis of large intestine without                            perforation or abscess without bleeding CPT copyright 2022 [employer]. All rights reserved. The codes documented in this report are preliminary and upon coder review may  be revised to meet current compliance requirements. Lamar HERO. Doak Mah, MD Lamar Ozell Hollingshead, MD 09/30/2024 11:06:47 AM This report has been signed electronically. Number of Addenda: 0

## 2024-10-03 ENCOUNTER — Encounter (HOSPITAL_COMMUNITY): Payer: Self-pay | Admitting: Internal Medicine
